# Patient Record
Sex: Female | Born: 1975 | Race: Black or African American | Hispanic: No | Marital: Single | Smoking: Never smoker
Health system: Southern US, Community
[De-identification: ages and names within clinical notes are randomized; demographics above are authoritative.]

## PROBLEM LIST (undated history)

## (undated) DIAGNOSIS — R519 Headache, unspecified: Secondary | ICD-10-CM

## (undated) DIAGNOSIS — E7800 Pure hypercholesterolemia, unspecified: Secondary | ICD-10-CM

## (undated) DIAGNOSIS — K219 Gastro-esophageal reflux disease without esophagitis: Secondary | ICD-10-CM

## (undated) DIAGNOSIS — M549 Dorsalgia, unspecified: Secondary | ICD-10-CM

## (undated) DIAGNOSIS — M7989 Other specified soft tissue disorders: Secondary | ICD-10-CM

## (undated) DIAGNOSIS — G5600 Carpal tunnel syndrome, unspecified upper limb: Secondary | ICD-10-CM

## (undated) DIAGNOSIS — L304 Erythema intertrigo: Secondary | ICD-10-CM

## (undated) DIAGNOSIS — I10 Essential (primary) hypertension: Secondary | ICD-10-CM

## (undated) DIAGNOSIS — R7303 Prediabetes: Secondary | ICD-10-CM

## (undated) DIAGNOSIS — G4730 Sleep apnea, unspecified: Secondary | ICD-10-CM

## (undated) DIAGNOSIS — E559 Vitamin D deficiency, unspecified: Secondary | ICD-10-CM

## (undated) DIAGNOSIS — R4702 Dysphasia: Secondary | ICD-10-CM

## (undated) HISTORY — PX: ECTOPIC PREGNANCY SURGERY: SHX613

## (undated) HISTORY — DX: Erythema intertrigo: L30.4

## (undated) HISTORY — DX: Sleep apnea, unspecified: G47.30

## (undated) HISTORY — DX: Vitamin D deficiency, unspecified: E55.9

## (undated) HISTORY — PX: ABDOMINAL HYSTERECTOMY: SHX81

## (undated) HISTORY — PX: LYMPH NODE BIOPSY: SHX201

## (undated) HISTORY — DX: Gastro-esophageal reflux disease without esophagitis: K21.9

## (undated) HISTORY — DX: Dorsalgia, unspecified: M54.9

## (undated) HISTORY — DX: Carpal tunnel syndrome, unspecified upper limb: G56.00

## (undated) HISTORY — PX: TUBAL LIGATION: SHX77

## (undated) HISTORY — DX: Headache, unspecified: R51.9

## (undated) HISTORY — DX: Prediabetes: R73.03

## (undated) HISTORY — DX: Dysphasia: R47.02

## (undated) HISTORY — DX: Other specified soft tissue disorders: M79.89

## (undated) HISTORY — DX: Pure hypercholesterolemia, unspecified: E78.00

---

## 2008-10-31 DIAGNOSIS — R030 Elevated blood-pressure reading, without diagnosis of hypertension: Secondary | ICD-10-CM

## 2008-10-31 DIAGNOSIS — R42 Dizziness and giddiness: Secondary | ICD-10-CM

## 2008-10-31 DIAGNOSIS — E669 Obesity, unspecified: Secondary | ICD-10-CM

## 2008-10-31 HISTORY — DX: Obesity, unspecified: E66.9

## 2008-10-31 HISTORY — DX: Dizziness and giddiness: R42

## 2008-10-31 HISTORY — DX: Elevated blood-pressure reading, without diagnosis of hypertension: R03.0

## 2009-07-04 DIAGNOSIS — M2550 Pain in unspecified joint: Secondary | ICD-10-CM

## 2009-07-04 HISTORY — DX: Pain in unspecified joint: M25.50

## 2010-04-02 DIAGNOSIS — R059 Cough, unspecified: Secondary | ICD-10-CM

## 2010-04-02 DIAGNOSIS — R1310 Dysphagia, unspecified: Secondary | ICD-10-CM | POA: Insufficient documentation

## 2010-04-02 HISTORY — DX: Dysphagia, unspecified: R13.10

## 2010-04-02 HISTORY — DX: Cough, unspecified: R05.9

## 2010-05-07 DIAGNOSIS — B351 Tinea unguium: Secondary | ICD-10-CM

## 2010-05-07 DIAGNOSIS — R519 Headache, unspecified: Secondary | ICD-10-CM | POA: Insufficient documentation

## 2010-05-07 HISTORY — DX: Tinea unguium: B35.1

## 2010-08-21 DIAGNOSIS — M549 Dorsalgia, unspecified: Secondary | ICD-10-CM

## 2010-08-21 HISTORY — DX: Dorsalgia, unspecified: M54.9

## 2013-01-13 DIAGNOSIS — E88819 Insulin resistance, unspecified: Secondary | ICD-10-CM

## 2013-01-13 HISTORY — DX: Insulin resistance, unspecified: E88.819

## 2016-07-05 DIAGNOSIS — B009 Herpesviral infection, unspecified: Secondary | ICD-10-CM

## 2016-07-05 DIAGNOSIS — Z7689 Persons encountering health services in other specified circumstances: Secondary | ICD-10-CM

## 2016-07-05 HISTORY — DX: Herpesviral infection, unspecified: B00.9

## 2016-07-05 HISTORY — DX: Persons encountering health services in other specified circumstances: Z76.89

## 2017-09-16 ENCOUNTER — Institutional Professional Consult (permissible substitution): Payer: Self-pay | Admitting: Medical

## 2018-04-13 ENCOUNTER — Other Ambulatory Visit: Payer: Self-pay

## 2018-04-13 ENCOUNTER — Encounter (HOSPITAL_COMMUNITY): Payer: Self-pay | Admitting: Obstetrics and Gynecology

## 2018-04-13 ENCOUNTER — Ambulatory Visit (HOSPITAL_BASED_OUTPATIENT_CLINIC_OR_DEPARTMENT_OTHER)
Admission: RE | Admit: 2018-04-13 | Discharge: 2018-04-13 | Disposition: A | Discharge status: Home / Self Care | Payer: BLUE CROSS/BLUE SHIELD | Source: Ambulatory Visit | Attending: Emergency Medicine | Admitting: Emergency Medicine

## 2018-04-13 ENCOUNTER — Emergency Department (HOSPITAL_COMMUNITY)
Admission: EM | Admit: 2018-04-13 | Discharge: 2018-04-13 | Disposition: A | Discharge status: Home / Self Care | Payer: BLUE CROSS/BLUE SHIELD | Attending: Emergency Medicine | Admitting: Emergency Medicine

## 2018-04-13 DIAGNOSIS — R52 Pain, unspecified: Secondary | ICD-10-CM

## 2018-04-13 DIAGNOSIS — M79604 Pain in right leg: Secondary | ICD-10-CM | POA: Insufficient documentation

## 2018-04-13 DIAGNOSIS — Z79899 Other long term (current) drug therapy: Secondary | ICD-10-CM | POA: Insufficient documentation

## 2018-04-13 LAB — COMPREHENSIVE METABOLIC PANEL
ALT: 14 U/L (ref 0–44)
AST: 16 U/L (ref 15–41)
Albumin: 3.6 g/dL (ref 3.5–5.0)
Alkaline Phosphatase: 75 U/L (ref 38–126)
Anion gap: 6 (ref 5–15)
BUN: 11 mg/dL (ref 6–20)
CO2: 26 mmol/L (ref 22–32)
Calcium: 8.4 mg/dL — ABNORMAL LOW (ref 8.9–10.3)
Chloride: 107 mmol/L (ref 98–111)
Creatinine, Ser: 0.77 mg/dL (ref 0.44–1.00)
GFR calc Af Amer: >60 mL/min (ref >60)
GFR calc non Af Amer: >60 mL/min (ref >60)
Glucose, Bld: 95 mg/dL (ref 70–99)
Potassium: 3.9 mmol/L (ref 3.5–5.1)
Sodium: 139 mmol/L (ref 135–145)
Total Bilirubin: 0.4 mg/dL (ref 0.3–1.2)
Total Protein: 6.6 g/dL (ref 6.5–8.1)

## 2018-04-13 LAB — CBC WITH DIFFERENTIAL/PLATELET
ABS IMMATURE GRANULOCYTES: 0.01 10*3/uL (ref 0.00–0.07)
BASOS PCT: 0 %
Basophils Absolute: 0 10*3/uL (ref 0.0–0.1)
EOS ABS: 0.1 10*3/uL (ref 0.0–0.5)
EOS PCT: 3 %
HCT: 35.4 % — ABNORMAL LOW (ref 36.0–46.0)
Hemoglobin: 11.1 g/dL — ABNORMAL LOW (ref 12.0–15.0)
Immature Granulocytes: 0 %
Lymphocytes Relative: 41 %
Lymphs Abs: 1.6 10*3/uL (ref 0.7–4.0)
MCH: 27.9 pg (ref 26.0–34.0)
MCHC: 31.4 g/dL (ref 30.0–36.0)
MCV: 88.9 fL (ref 80.0–100.0)
MONO ABS: 0.4 10*3/uL (ref 0.1–1.0)
MONOS PCT: 9 %
Neutro Abs: 1.8 10*3/uL (ref 1.7–7.7)
Neutrophils Relative %: 47 %
PLATELETS: 310 10*3/uL (ref 150–400)
RBC: 3.98 MIL/uL (ref 3.87–5.11)
RDW: 13.7 % (ref 11.5–15.5)
WBC: 3.9 10*3/uL — ABNORMAL LOW (ref 4.0–10.5)
nRBC: 0 % (ref 0.0–0.2)

## 2018-04-13 MED ORDER — NAPROXEN 500 MG PO TABS: 500.0000 mg | ORAL_TABLET | Freq: Two times a day (BID) | ORAL | 0 refills | Status: DC

## 2018-04-13 MED ORDER — ENOXAPARIN SODIUM 150 MG/ML ~~LOC~~ SOLN
130.0000 mg | SUBCUTANEOUS | Status: AC
  Administered 2018-04-13: 130 mg via SUBCUTANEOUS
  Filled 2018-04-13: qty 0.87

## 2018-04-13 MED ORDER — DEXAMETHASONE 2 MG PO TABS: 10.0000 mg | ORAL_TABLET | Freq: Two times a day (BID) | ORAL | 0 refills | Status: AC

## 2018-04-13 MED ORDER — CYCLOBENZAPRINE HCL 10 MG PO TABS: 10.0000 mg | ORAL_TABLET | Freq: Two times a day (BID) | ORAL | 0 refills | Status: DC | PRN

## 2018-04-13 NOTE — Progress Notes (Signed)
Right lower extremity venous duplex has been completed. Negative for DVT.  04/13/18 9:22 AM Olen Cordial RVT

## 2018-04-13 NOTE — ED Notes (Signed)
Pt verbalized understanding of prescription and when to take medications.

## 2018-04-13 NOTE — ED Provider Notes (Signed)
[facility]-EMERGENCY DEPT Provider Note   CSN: [id number] Arrival date & time: 04/13/18  0033     History   Chief Complaint Chief Complaint  Patient presents with  . Leg Pain    HPI Melinda Ward is a 42 y.o. female.  HPI  2.5wk ago Right leg pain hip down to ankle, burning tingling aching pain Swelling on that leg Pain from ankle to knee feels like a twisting pain, hurts, severe Last week had back pain but thought was from menses Located front of leg and goes straight down, also reports calf pain No injuries  No hx of DVT/PE, not on birth control, no recent surgeries.  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 No pertinent past medical history.  There are no active problems to display for this patient.      OB History    Gravida      Para      Term      Preterm      AB      Living  0     SAB      TAB      Ectopic      Multiple      Live Births               Home Medications    Prior to Admission medications   Medication Sig Start Date End Date Taking? Authorizing Provider  ibuprofen (ADVIL,MOTRIN) 200 MG tablet Take 800 mg by mouth every 6 (six) hours as needed for moderate pain.   Yes [provider]  mirabegron ER (MYRBETRIQ) 25 MG TB24 tablet Take 25 mg by mouth daily.   Yes [provider]  valACYclovir (VALTREX) 500 MG tablet Take 500 mg by mouth daily.   Yes [provider]  cyclobenzaprine (FLEXERIL) 10 MG tablet Take 1 tablet (10 mg total) by mouth 2 (two) times daily as needed for muscle spasms. 04/13/18   Alvira Monday, MD  dexamethasone (DECADRON) 2 MG tablet Take 5 tablets (10 mg total) by mouth 2 (two) times daily with a meal for 1 dose. 04/13/18 04/14/18  Alvira Monday, MD  naproxen (NAPROSYN) 500 MG tablet Take 1 tablet (500 mg total) by mouth 2 (two) times  daily. 04/13/18   Alvira Monday, MD    Family History No family history on file.  Social History Social History   Tobacco Use  . Smoking status: Never Smoker  . Smokeless tobacco: Never Used  Substance Use Topics  . Alcohol use: Yes    Comment: Rarely  . Drug use: Never     Allergies   Patient has no known allergies.   Review of Systems Review of Systems  Constitutional: Negative for fever.  HENT: Negative for sore throat.   Eyes: Negative for visual disturbance.  Respiratory: Negative for cough and shortness of breath.   Cardiovascular: Positive for leg swelling. Negative for chest pain.  Gastrointestinal: Negative for abdominal pain.  Genitourinary: Negative for difficulty urinating.  Musculoskeletal: Positive for arthralgias and myalgias. Negative for back pain and neck pain.  Skin: Negative for rash.  Neurological: Negative for syncope, weakness and headaches.     Physical Exam Updated Vital Signs BP 138/83 (BP Location: Left Arm)   Pulse (!) 53   Temp 98.2 F (36.8 C) (Oral)   Resp 16   Ht 5\' 7"  (1.702 m)  Wt 128.8 kg   LMP 04/04/2018 (Exact Date)   SpO2 100%   BMI 44.48 kg/m   Physical Exam  Constitutional: She is oriented to person, place, and time. She appears well-developed and well-nourished. No distress.  HENT:  Head: Normocephalic and atraumatic.  Eyes: Conjunctivae and EOM are normal.  Neck: Normal range of motion.  Cardiovascular: Normal rate, regular rhythm, normal heart sounds and intact distal pulses. Exam reveals no gallop and no friction rub.  No murmur heard. Pulmonary/Chest: Effort normal and breath sounds normal. No respiratory distress. She has no wheezes. She has no rales.  Abdominal: Soft. She exhibits no distension. There is no tenderness. There is no guarding.  Musculoskeletal: She exhibits no edema or tenderness.  Altered sensation proximal leg anteriorly, lower leg medially Normal strength socklines bilaterally, no  significant edema  Neurological: She is alert and oriented to person, place, and time.  Skin: Skin is warm and dry. No rash noted. She is not diaphoretic. No erythema.  Nursing note and vitals reviewed.    ED Treatments / Results  Labs (all labs ordered are listed, but only abnormal results are displayed) Labs Reviewed  CBC WITH DIFFERENTIAL/PLATELET - Abnormal; Notable for the following components:      Result Value   WBC 3.9 (*)    Hemoglobin 11.1 (*)    HCT 35.4 (*)    All other components within normal limits  COMPREHENSIVE METABOLIC PANEL - Abnormal; Notable for the following components:   Calcium 8.4 (*)    All other components within normal limits    EKG None  Radiology Le Venous  Result Date: 04/13/2018  Lower Venous Study Indications: Pain.  Limitations: Body habitus and poor ultrasound/tissue interface. Performing Technologist: Chanda Busing RVT  Examination Guidelines: A complete evaluation includes B-mode imaging, spectral Doppler, color Doppler, and power Doppler as needed of all accessible portions of each vessel. Bilateral testing is considered an integral part of a complete examination. Limited examinations for reoccurring indications may be performed as noted.  Right Venous Findings: +---------+---------------+---------+-----------+----------+-------+          CompressibilityPhasicitySpontaneityPropertiesSummary +---------+---------------+---------+-----------+----------+-------+ CFV      Full           Yes      Yes                          +---------+---------------+---------+-----------+----------+-------+ SFJ      Full                                                 +---------+---------------+---------+-----------+----------+-------+ FV Prox  Full                                                 +---------+---------------+---------+-----------+----------+-------+ FV Mid                  Yes      Yes                           +---------+---------------+---------+-----------+----------+-------+ FV DistalFull                                                 +---------+---------------+---------+-----------+----------+-------+  PFV      Full                                                 +---------+---------------+---------+-----------+----------+-------+ POP      Full           Yes      Yes                          +---------+---------------+---------+-----------+----------+-------+ PTV      Full                                                 +---------+---------------+---------+-----------+----------+-------+ PERO     Full                                                 +---------+---------------+---------+-----------+----------+-------+  Left Venous Findings: +---+---------------+---------+-----------+----------+-------+    CompressibilityPhasicitySpontaneityPropertiesSummary +---+---------------+---------+-----------+----------+-------+ CFVFull           Yes      Yes                          +---+---------------+---------+-----------+----------+-------+    Summary: Right: There is no evidence of deep vein thrombosis in the lower extremity. However, portions of this examination were limited- see technologist comments above. No cystic structure found in the popliteal fossa. Left: No evidence of common femoral vein obstruction.  *See table(s) above for measurements and observations. Electronically signed by Fabienne Bruns MD on 04/13/2018 at 4:23:25 PM.    Final     Procedures Procedures (including critical care time)  Medications Ordered in ED Medications  enoxaparin (LOVENOX) injection 130 mg (130 mg Subcutaneous Given 04/13/18 0329)     Initial Impression / Assessment and Plan / ED Course  I have reviewed the triage vital signs and the nursing notes.  Pertinent labs & imaging results that were available during my care of the patient were reviewed by me and considered in my  medical decision making (see chart for details).    42 year old female presents with concern for right lower extremity pain, paresthesias and swelling.  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   She has good pulses bilaterally, no sign of arterial occlusion.  No sign of cellulitis or septic arthritis.  No history of trauma, doubt fracture.  If DVT study is negative, suspect most likely etiology of her symptoms is radicular pain coming from the level of L3-L4.  Will give prescription for Decadron, naproxen, Flexeril that patient may initiate in a few days if DVT study is negative. Recommend PCP follow up.  Final Clinical Impressions(s) / ED Diagnoses   Final diagnoses:  Right leg pain    ED Discharge Orders         Ordered    dexamethasone (DECADRON) 2 MG tablet  2 times daily with meals     04/13/18 0429    naproxen (NAPROSYN) 500 MG tablet  2 times daily     04/13/18 0429    cyclobenzaprine (FLEXERIL) 10 MG tablet  2 times daily PRN     04/13/18 0429    LE VENOUS     04/13/18 0417           Alvira Monday, MD 04/13/18 1916

## 2018-04-13 NOTE — Discharge Instructions (Addendum)
I recommend having a DVT study today. If this is positive, you will be continued on blood thinners. If it is negative, I suspect likely radicular etiology of your pain (suspect disc herniation or other at level of L3-L4) and recommend PCP follow up (either way.)  I have provided you with a prescription for a one time dose of steroid, as well as naproxen to take beginning 11/13--ONLY if there are no signs of blood clot. DO NOT TAKE THESE MEDICATIONS UNLESS YOU HAVE NO SIGNS OF BLOOD CLOT, and please do not initiate until 11/13.  If the study does not show blood clots, you may fill and take these medications. You may fill the prescription for the muscle relaxant, flexeril, now.

## 2018-04-13 NOTE — ED Triage Notes (Signed)
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 Pt reports she has felt swelling and heat to the area and the pain is shooting up her thigh and down to her ankle. Pt describes it as feeling like a blowtorch to the calf.  Pt is concerned about a DVT and does not want to go back on the road without having it checked.

## 2018-08-18 ENCOUNTER — Other Ambulatory Visit: Payer: Self-pay

## 2018-08-18 ENCOUNTER — Encounter (HOSPITAL_COMMUNITY): Payer: Self-pay | Admitting: *Deleted

## 2018-08-18 ENCOUNTER — Emergency Department (HOSPITAL_COMMUNITY)
Admission: EM | Admit: 2018-08-18 | Discharge: 2018-08-18 | Disposition: A | Discharge status: Home / Self Care | Payer: BLUE CROSS/BLUE SHIELD | Attending: Emergency Medicine | Admitting: Emergency Medicine

## 2018-08-18 DIAGNOSIS — Z209 Contact with and (suspected) exposure to unspecified communicable disease: Secondary | ICD-10-CM | POA: Insufficient documentation

## 2018-08-18 DIAGNOSIS — Z79899 Other long term (current) drug therapy: Secondary | ICD-10-CM | POA: Insufficient documentation

## 2018-08-18 DIAGNOSIS — B349 Viral infection, unspecified: Secondary | ICD-10-CM | POA: Insufficient documentation

## 2018-08-18 MED ORDER — ONDANSETRON HCL 4 MG PO TABS: 4.0000 mg | ORAL_TABLET | Freq: Four times a day (QID) | ORAL | 0 refills | Status: DC

## 2018-08-18 NOTE — ED Provider Notes (Signed)
[facility] EMERGENCY DEPARTMENT Provider Note   CSN: [id number] Arrival date & time: 08/18/18  1158   History   Chief Complaint Chief Complaint  Patient presents with  . Influenza    HPI Melinda Ward is a 43 y.o. female.     HPI   42 year old female presents today with complaints of respiratory infection.  Patient notes symptoms started yesterday with sore throat, body aches, nausea vomiting and diarrhea.  She notes that her roommate is sick with similar symptoms.  She denies any recent travel or abnormal exposures.  She denies any significant past medical history she is a non-smoker.  No medications prior to arrival today.   History reviewed. No pertinent past medical history.  There are no active problems to display for this patient.   Past Surgical History:  Procedure Laterality Date  . ECTOPIC PREGNANCY SURGERY    . LYMPH NODE BIOPSY    . TUBAL LIGATION       OB History    Gravida      Para      Term      Preterm      AB      Living  0     SAB      TAB      Ectopic      Multiple      Live Births               Home Medications    Prior to Admission medications   Medication Sig Start Date End Date Taking? Authorizing Provider  cyclobenzaprine (FLEXERIL) 10 MG tablet Take 1 tablet (10 mg total) by mouth 2 (two) times daily as needed for muscle spasms. 04/13/18   Alvira Monday, MD  ibuprofen (ADVIL,MOTRIN) 200 MG tablet Take 800 mg by mouth every 6 (six) hours as needed for moderate pain.    [provider]  mirabegron ER (MYRBETRIQ) 25 MG TB24 tablet Take 25 mg by mouth daily.    [provider]  naproxen (NAPROSYN) 500 MG tablet Take 1 tablet (500 mg total) by mouth 2 (two) times daily. 04/13/18   Alvira Monday, MD  ondansetron (ZOFRAN) 4 MG tablet Take 1 tablet (4 mg total) by mouth every 6 (six) hours. 08/18/18   Saron Vanorman, Tinnie Gens, PA-C  valACYclovir (VALTREX) 500 MG tablet Take 500 mg by  mouth daily.    [provider]    Family History History reviewed. No pertinent family history.  Social History Social History   Tobacco Use  . Smoking status: Never Smoker  . Smokeless tobacco: Never Used  Substance Use Topics  . Alcohol use: Yes    Comment: Rarely  . Drug use: Never     Allergies   Patient has no known allergies.   Review of Systems Review of Systems  All other systems reviewed and are negative.    Physical Exam Updated Vital Signs BP 132/90 (BP Location: Right Arm)   Pulse 78   Temp 98.3 F (36.8 C) (Oral)   Resp 16   SpO2 99%   Physical Exam Vitals signs and nursing note reviewed.  Constitutional:      Appearance: She is well-developed.  HENT:     Head: Normocephalic and atraumatic.     Comments: Oropharynx clear no erythema exudate or edema Eyes:     General: No scleral icterus.       Right eye: No discharge.        Left eye: No discharge.  Conjunctiva/sclera: Conjunctivae normal.     Pupils: Pupils are equal, round, and reactive to light.  Neck:     Musculoskeletal: Normal range of motion.     Vascular: No JVD.     Trachea: No tracheal deviation.  Pulmonary:     Effort: Pulmonary effort is normal. No respiratory distress.     Breath sounds: No stridor. No wheezing or rhonchi.  Neurological:     Mental Status: She is alert and oriented to person, place, and time.     Coordination: Coordination normal.  Psychiatric:        Behavior: Behavior normal.        Thought Content: Thought content normal.        Judgment: Judgment normal.     ED Treatments / Results  Labs (all labs ordered are listed, but only abnormal results are displayed) Labs Reviewed - No data to display  EKG None  Radiology No results found.  Procedures Procedures (including critical care time)  Medications Ordered in ED Medications - No data to display   Initial Impression / Assessment and Plan / ED Course  I have reviewed the  triage vital signs and the nursing notes.  Pertinent labs & imaging results that were available during my care of the patient were reviewed by me and considered in my medical decision making (see chart for details).        Labs:   Imaging:  Consults:  Therapeutics:  Discharge Meds: Zofran  Assessment/Plan: 43 year old female presents today with concerns for viral illness.  She is very well-appearing in no acute distress.  She has no signs of acute bacterial etiology.  Low suspicion for influenza, no significant risk factors for coronavirus.  Patient will be discharged with symptomatic care and strict return precautions.  She verbalized understanding and agreement to today's plan had no further questions or concerns at time of discharge.  Final Clinical Impressions(s) / ED Diagnoses   Final diagnoses:  Viral illness    ED Discharge Orders         Ordered    ondansetron (ZOFRAN) 4 MG tablet  Every 6 hours     08/18/18 1237           Eyvonne Mechanic, PA-C 08/18/18 1238    Charlynne Pander, MD 08/18/18 1759

## 2018-08-18 NOTE — ED Notes (Signed)
Patient verbalizes understanding of discharge instructions . Opportunity for questions and answers were provided . Armband removed by staff ,Pt discharged from ED. W/C  offered at D/C  and Declined W/C at D/C and was escorted to lobby by RN.  

## 2018-08-18 NOTE — Discharge Instructions (Addendum)
Please read attached information. If you experience any new or worsening signs or symptoms please return to the emergency room for evaluation. Please follow-up with your primary care provider or specialist as discussed. Please use medication prescribed only as directed and discontinue taking if you have any concerning signs or symptoms.   °

## 2018-08-24 ENCOUNTER — Other Ambulatory Visit: Payer: Self-pay

## 2018-08-24 ENCOUNTER — Encounter (HOSPITAL_COMMUNITY): Payer: Self-pay | Admitting: *Deleted

## 2018-08-24 ENCOUNTER — Emergency Department (HOSPITAL_COMMUNITY)
Admission: EM | Admit: 2018-08-24 | Discharge: 2018-08-24 | Disposition: A | Discharge status: Home / Self Care | Payer: Self-pay | Attending: Emergency Medicine | Admitting: Emergency Medicine

## 2018-08-24 DIAGNOSIS — B9789 Other viral agents as the cause of diseases classified elsewhere: Secondary | ICD-10-CM

## 2018-08-24 DIAGNOSIS — J069 Acute upper respiratory infection, unspecified: Secondary | ICD-10-CM | POA: Insufficient documentation

## 2018-08-24 DIAGNOSIS — Z79899 Other long term (current) drug therapy: Secondary | ICD-10-CM | POA: Insufficient documentation

## 2018-08-24 NOTE — ED Triage Notes (Signed)
PT returns for recheck of cough.

## 2018-08-24 NOTE — Discharge Instructions (Signed)
Please continue over the counter remedies for cough/cold Return if worsening

## 2018-08-24 NOTE — ED Notes (Signed)
Patient verbalizes understanding of discharge instructions . Opportunity for questions and answers were provided . Armband removed by staff ,Pt discharged from ED. W/C  offered at D/C  and Declined W/C at D/C and was escorted to lobby by RN.  

## 2018-08-24 NOTE — ED Provider Notes (Signed)
[facility] EMERGENCY DEPARTMENT Provider Note   CSN: [id number] Arrival date & time: 08/24/18  1348    History   Chief Complaint Chief Complaint  Patient presents with  . Cough    HPI Melinda Ward is a 43 y.o. female who presents with URI symptoms and cough. She states that she has had URI symptoms for about 8 days. She was seen in the ED on 3/17 for the same. She states that she's been out of work and was told not to return until she was feeling better. She developed a fever over the weekend of 101. This lasted until Sunday and has gotten better. She reports some lingering ear fullness on the L side, some throat swelling, and a mild cough. She has been taking OTC meds with relief. She denies any travel or sick contacts. No headache, chest pain, SOB. She wants to go back to work.     HPI  History reviewed. No pertinent past medical history.  There are no active problems to display for this patient.   Past Surgical History:  Procedure Laterality Date  . ECTOPIC PREGNANCY SURGERY    . LYMPH NODE BIOPSY    . TUBAL LIGATION       OB History    Gravida      Para      Term      Preterm      AB      Living  0     SAB      TAB      Ectopic      Multiple      Live Births               Home Medications    Prior to Admission medications   Medication Sig Start Date End Date Taking? Authorizing Provider  cyclobenzaprine (FLEXERIL) 10 MG tablet Take 1 tablet (10 mg total) by mouth 2 (two) times daily as needed for muscle spasms. 04/13/18   Alvira Monday, MD  ibuprofen (ADVIL,MOTRIN) 200 MG tablet Take 800 mg by mouth every 6 (six) hours as needed for moderate pain.    [provider]  mirabegron ER (MYRBETRIQ) 25 MG TB24 tablet Take 25 mg by mouth daily.    [provider]  naproxen (NAPROSYN) 500 MG tablet Take 1 tablet (500 mg total) by mouth 2 (two) times daily. 04/13/18   Alvira Monday, MD  ondansetron  (ZOFRAN) 4 MG tablet Take 1 tablet (4 mg total) by mouth every 6 (six) hours. 08/18/18   Hedges, Tinnie Gens, PA-C  valACYclovir (VALTREX) 500 MG tablet Take 500 mg by mouth daily.    [provider]    Family History History reviewed. No pertinent family history.  Social History Social History   Tobacco Use  . Smoking status: Never Smoker  . Smokeless tobacco: Never Used  Substance Use Topics  . Alcohol use: Yes    Comment: Rarely  . Drug use: Never     Allergies   Patient has no known allergies.   Review of Systems Review of Systems  Constitutional: Positive for fever (resolved).  HENT: Positive for congestion.   Respiratory: Positive for cough. Negative for shortness of breath.   Cardiovascular: Negative for chest pain.  All other systems reviewed and are negative.    Physical Exam Updated Vital Signs BP 135/86 (BP Location: Right Arm)   Pulse 85   Temp 98.6 F (37 C) (Oral)   Resp 20   SpO2 100%  Physical Exam Vitals signs and nursing note reviewed.  Constitutional:      General: She is not in acute distress.    Appearance: Normal appearance. She is well-developed. She is not ill-appearing.     Comments: Calm and cooperative. Pleasant. Well appearing  HENT:     Head: Normocephalic and atraumatic.     Right Ear: Tympanic membrane normal.     Left Ear: Tympanic membrane normal.     Nose: Nose normal.     Mouth/Throat:     Mouth: Mucous membranes are moist.  Eyes:     General: No scleral icterus.       Right eye: No discharge.        Left eye: No discharge.     Conjunctiva/sclera: Conjunctivae normal.     Pupils: Pupils are equal, round, and reactive to light.  Neck:     Musculoskeletal: Normal range of motion.  Cardiovascular:     Rate and Rhythm: Normal rate and regular rhythm.  Pulmonary:     Effort: Pulmonary effort is normal. No respiratory distress.     Breath sounds: Normal breath sounds.  Abdominal:     General: There is no  distension.  Skin:    General: Skin is warm and dry.  Neurological:     Mental Status: She is alert and oriented to person, place, and time.  Psychiatric:        Behavior: Behavior normal.      ED Treatments / Results  Labs (all labs ordered are listed, but only abnormal results are displayed) Labs Reviewed - No data to display  EKG None  Radiology No results found.  Procedures Procedures (including critical care time)  Medications Ordered in ED Medications - No data to display   Initial Impression / Assessment and Plan / ED Course  I have reviewed the triage vital signs and the nursing notes.  Pertinent labs & imaging results that were available during my care of the patient were reviewed by me and considered in my medical decision making (see chart for details).  43 year old female presents with resolving URI symptoms and cough.  She was seen last week for the same.  She states that overall she is been improving and is primarily here for a work note.  Her vital signs are normal.  Exam is overall unremarkable.  She states she last had a fever on Saturday.  She was given a work note to go back on Wednesday.  Final Clinical Impressions(s) / ED Diagnoses   Final diagnoses:  Viral URI with cough    ED Discharge Orders    None       Bethel Born, PA-C 08/24/18 1531    Blane Ohara, MD 08/24/18 1911

## 2018-09-09 ENCOUNTER — Other Ambulatory Visit: Payer: Self-pay | Admitting: Obstetrics and Gynecology

## 2018-09-09 DIAGNOSIS — N6310 Unspecified lump in the right breast, unspecified quadrant: Secondary | ICD-10-CM

## 2018-10-05 ENCOUNTER — Ambulatory Visit
Admission: RE | Admit: 2018-10-05 | Discharge: 2018-10-05 | Disposition: A | Discharge status: Home / Self Care | Payer: Managed Care, Other (non HMO) | Source: Ambulatory Visit | Attending: Obstetrics and Gynecology | Admitting: Obstetrics and Gynecology

## 2018-10-05 ENCOUNTER — Other Ambulatory Visit: Payer: Self-pay

## 2018-10-05 DIAGNOSIS — N6310 Unspecified lump in the right breast, unspecified quadrant: Secondary | ICD-10-CM

## 2019-02-19 DIAGNOSIS — K219 Gastro-esophageal reflux disease without esophagitis: Secondary | ICD-10-CM | POA: Insufficient documentation

## 2019-02-19 DIAGNOSIS — H6993 Unspecified Eustachian tube disorder, bilateral: Secondary | ICD-10-CM

## 2019-02-19 DIAGNOSIS — J310 Chronic rhinitis: Secondary | ICD-10-CM

## 2019-02-19 DIAGNOSIS — H6983 Other specified disorders of Eustachian tube, bilateral: Secondary | ICD-10-CM | POA: Insufficient documentation

## 2019-02-19 DIAGNOSIS — R0989 Other specified symptoms and signs involving the circulatory and respiratory systems: Secondary | ICD-10-CM | POA: Insufficient documentation

## 2019-02-19 DIAGNOSIS — R09A2 Foreign body sensation, throat: Secondary | ICD-10-CM

## 2019-02-19 HISTORY — DX: Chronic rhinitis: J31.0

## 2019-02-19 HISTORY — DX: Foreign body sensation, throat: R09.A2

## 2019-02-19 HISTORY — DX: Gastro-esophageal reflux disease without esophagitis: K21.9

## 2019-02-19 HISTORY — DX: Unspecified eustachian tube disorder, bilateral: H69.93

## 2019-05-05 DIAGNOSIS — G5711 Meralgia paresthetica, right lower limb: Secondary | ICD-10-CM

## 2019-05-05 HISTORY — DX: Meralgia paresthetica, right lower limb: G57.11

## 2019-05-14 ENCOUNTER — Other Ambulatory Visit: Payer: Self-pay

## 2019-05-14 ENCOUNTER — Other Ambulatory Visit (HOSPITAL_COMMUNITY): Payer: Self-pay | Admitting: Obstetrics and Gynecology

## 2019-05-14 ENCOUNTER — Ambulatory Visit (HOSPITAL_COMMUNITY)
Admission: RE | Admit: 2019-05-14 | Discharge: 2019-05-14 | Disposition: A | Discharge status: Home / Self Care | Payer: Managed Care, Other (non HMO) | Source: Ambulatory Visit | Attending: Obstetrics and Gynecology | Admitting: Obstetrics and Gynecology

## 2019-05-14 DIAGNOSIS — M79605 Pain in left leg: Secondary | ICD-10-CM

## 2019-05-14 DIAGNOSIS — M7989 Other specified soft tissue disorders: Secondary | ICD-10-CM | POA: Insufficient documentation

## 2019-05-14 NOTE — Progress Notes (Addendum)
LLE venous duplex       has been completed. Preliminary results can be found under CV proc through chart review. June Leap, BS, RDMS, RVT  Attempted call report with no answer. Office is closed.

## 2019-07-13 ENCOUNTER — Institutional Professional Consult (permissible substitution): Payer: Managed Care, Other (non HMO) | Admitting: Pulmonary Disease

## 2020-04-14 ENCOUNTER — Other Ambulatory Visit: Payer: Self-pay

## 2020-04-14 ENCOUNTER — Encounter: Payer: Self-pay | Admitting: Podiatry

## 2020-04-14 ENCOUNTER — Ambulatory Visit (INDEPENDENT_AMBULATORY_CARE_PROVIDER_SITE_OTHER): Payer: 59

## 2020-04-14 ENCOUNTER — Ambulatory Visit (INDEPENDENT_AMBULATORY_CARE_PROVIDER_SITE_OTHER): Payer: 59 | Admitting: Podiatry

## 2020-04-14 DIAGNOSIS — M2011 Hallux valgus (acquired), right foot: Secondary | ICD-10-CM

## 2020-04-14 DIAGNOSIS — M79671 Pain in right foot: Secondary | ICD-10-CM | POA: Diagnosis not present

## 2020-04-14 DIAGNOSIS — Z01818 Encounter for other preprocedural examination: Secondary | ICD-10-CM

## 2020-04-14 NOTE — Progress Notes (Signed)
Subjective:  Patient ID: Melinda Ward, female    DOB: 1975/06/12,  MRN: [id number]  Chief Complaint  Patient presents with  . Toe Pain    Right toe going under 2nd toe. Painful to wear shoes     44 y.o. female presents with the above complaint.  Patient presents with complaint of painful bunion deformity to the right toe.  Patient states that it started to get progressively worse.  She states that this has been going on for quite some time and over time it the big toe most on the right under the second toe.  She states she cannot wear normal shoes because it causes pain.  She would like to discuss treatment options for this.  She denies any other acute complaints.  She has tried all conservative treatment options including padding protecting spacers but none of which has helped.  She at this time would like to discuss surgical options for this.  She is not a diabetic.  Review of Systems: Negative except as noted in the HPI. Denies N/V/F/Ch.  History reviewed. No pertinent past medical history.  Current Outpatient Medications:  .  cyclobenzaprine (FLEXERIL) 10 MG tablet, Take 1 tablet (10 mg total) by mouth 2 (two) times daily as needed for muscle spasms., Disp: 20 tablet, Rfl: 0 .  fluticasone (FLOVENT DISKUS) 50 MCG/BLIST diskus inhaler, Inhale into the lungs., Disp: , Rfl:  .  ibuprofen (ADVIL,MOTRIN) 200 MG tablet, Take 800 mg by mouth every 6 (six) hours as needed for moderate pain., Disp: , Rfl:  .  mirabegron ER (MYRBETRIQ) 25 MG TB24 tablet, Take 25 mg by mouth daily., Disp: , Rfl:  .  naproxen (NAPROSYN) 500 MG tablet, Take 1 tablet (500 mg total) by mouth 2 (two) times daily., Disp: 30 tablet, Rfl: 0 .  ondansetron (ZOFRAN) 4 MG tablet, Take 1 tablet (4 mg total) by mouth every 6 (six) hours., Disp: 12 tablet, Rfl: 0 .  valACYclovir (VALTREX) 500 MG tablet, Take 500 mg by mouth daily., Disp: , Rfl:   Social History   Tobacco Use  Smoking Status Never Smoker  Smokeless  Tobacco Never Used    Allergies  Allergen Reactions  . Latex Rash   Objective:  There were no vitals filed for this visit. There is no height or weight on file to calculate BMI. Constitutional Well developed. Well nourished.  Vascular Dorsalis pedis pulses palpable bilaterally. Posterior tibial pulses palpable bilaterally. Capillary refill normal to all digits.  No cyanosis or clubbing noted. Pedal hair growth normal.  Neurologic Normal speech. Oriented to person, place, and time. Epicritic sensation to light touch grossly present bilaterally.  Dermatologic Nails well groomed and normal in appearance. No open wounds. No skin lesions.  Orthopedic: Normal joint ROM without pain or crepitus bilaterally. Hallux abductovalgus deformity present right.  This is a reducible deformity.  No intra-articular first metatarsophalangeal joint pain. left 1st MPJ full range of motion. Left 1st TMT without gross hypermobility. Right 1st MPJ diminished range of motion  Right 1st TMT without gross hypermobility. Lesser digital contractures absent bilaterally.   Radiographs: Taken and reviewed. Hallux abductovalgus deformity present. Metatarsal parabola normal. 1st/2nd IMA: Moderate; TSP: 4 out of 7.  Mild to moderate bunion deformity noted.  There is an increasing amount of phalangeal's angle.  There is increasing hallux abductus angle.  Assessment:   1. Foot pain, right   2. Hallux abducto valgus, right   3. Preoperative examination    Plan:  Patient was evaluated and  treated and all questions answered.  Hallux abductovalgus deformity, right -XR as above. -Patient has failed all conservative therapy and wishes to proceed with surgical intervention. All risks, benefits, and alternatives discussed with patient. No guarantees given. Consent reviewed and signed by patient. Post-op course explained at length. -Planned procedures: Chevron osteotomy with screw fixation with possible phalangeal  osteotomy. -Risk factors: None -I explained patient the etiology of hallux bunion deformity and various treatment options were discussed.  I believe patient will benefit of surgical intervention with correction of the bunion.  I discussed with the patient extensive to my surgical plan as well as postop protocol which includes weightbearing as tolerated in surgical shoe.  Patient states understanding like to proceed with the surgery. -Informed surgical risk consent was reviewed and read aloud to the patient.  I reviewed the films.  I have discussed my findings with the patient in great detail.  I have discussed all risks including but not limited to infection, stiffness, scarring, limp, disability, deformity, damage to blood vessels and nerves, numbness, poor healing, need for braces, arthritis, chronic pain, amputation, death.  All benefits and realistic expectations discussed in great detail.  I have made no promises as to the outcome.  I have provided realistic expectations.  I have offered the patient a 2nd opinion, which they have declined and assured me they preferred to proceed despite the risks -A total of 45 minutes was spent in direct patient care as well as pre and post patient encounter activities.  This includes documentation as well as reviewing patient chart for labs, imaging, past medical, surgical, social, and family history as documented in the EMR.  I have reviewed medication allergies as documented in EMR.  I discussed the etiology of condition and treatment options from conservative to surgical care.  All risks and benefit of the treatment course was discussed in detail.  All questions were answered and return appointment was discussed.  Since the visit completed in an ambulatory/outpatient setting, the patient and/or parent/guardian has been advised to contact the providers office for worsening condition and seek medical treatment and/or call 911 if the patient deems either is  necessary.   No follow-ups on file.

## 2020-04-18 ENCOUNTER — Other Ambulatory Visit: Payer: Self-pay | Admitting: Podiatry

## 2020-04-18 DIAGNOSIS — M2011 Hallux valgus (acquired), right foot: Secondary | ICD-10-CM

## 2020-04-25 ENCOUNTER — Ambulatory Visit (INDEPENDENT_AMBULATORY_CARE_PROVIDER_SITE_OTHER): Payer: 59 | Admitting: Orthotics

## 2020-04-25 ENCOUNTER — Other Ambulatory Visit: Payer: Self-pay

## 2020-04-25 DIAGNOSIS — M2011 Hallux valgus (acquired), right foot: Secondary | ICD-10-CM

## 2020-04-25 DIAGNOSIS — M2012 Hallux valgus (acquired), left foot: Secondary | ICD-10-CM

## 2020-04-25 DIAGNOSIS — M79671 Pain in right foot: Secondary | ICD-10-CM

## 2020-04-26 ENCOUNTER — Other Ambulatory Visit: Payer: 59 | Admitting: Orthotics

## 2020-04-26 NOTE — Progress Notes (Signed)
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 Patient is active youth and demonstrates over pronation in gait, prominent medially shifted talus, and collapse of medial column.  Goals are RF stability, longitudinal arch support, decrease in pronation, and ease of discomfort in mobility related activities.   

## 2020-05-03 HISTORY — PX: FOOT SURGERY: SHX648

## 2020-05-04 ENCOUNTER — Telehealth: Payer: Self-pay | Admitting: Podiatry

## 2020-05-04 NOTE — Telephone Encounter (Signed)
Pt also stated they said something about them being medically necessary.

## 2020-05-04 NOTE — Telephone Encounter (Signed)
Pt called and stated she got information from insurance company that they are denying the orthotic claim but they would cover if we got a pre authorization for it.   I told pt I would call and get the prior auth started.

## 2020-05-04 NOTE — Telephone Encounter (Signed)
DOS: 05-15-2020  Procedures: [place] [phone]) and Double Osteotomy Rt (74099)  [employer] Effective From 06/04/2019 - 06/02/2020  Deductible: $1,800 with $ 2,780.04 met and $ 35.80 remains. Out of Pocket: $3,600 with $1,764.20 met and $1,835.80 remains. CoInsurance: 20% Copay: $0  No referrals are required.

## 2020-05-05 ENCOUNTER — Telehealth: Payer: Self-pay

## 2020-05-05 NOTE — Telephone Encounter (Signed)
Do you have prefilled template or something I could sign

## 2020-05-05 NOTE — Telephone Encounter (Signed)
DOS 05/15/2020  [place] BUNIONECTOMY RT - 28296 DOUBLE OSTEOTOMY RT - 28299  [facility] EFFECTIVE DATE - 06/04/2019  PLAN DEDUCTIBLE - $1800.00 W/ $35.80 REMAINING OUT OF POCKET - $3600.00 W/ $1835.80 REMAINING COPAY $0.00 COINSURANCE - 20%   NOTIFICATION/PRIOR AUTHORIZATION NUMBER CASE STATUS CASE STATUS REASON PRIMARY CARE PHYSICIAN [id number] Closed Case Was Managed And Is Now Complete - ADVANCE NOTIFY DATE/TIME ADMISSION NOTIFY DATE/TIME 05/04/2020 09:51 AM CST - COVERAGE STATUS OVERALL COVERAGE STATUS Covered/Approved 1-2 CODE DESCRIPTION COVERAGE STATUS DECISION DATE [facility] Coverage determination is reflected for the facility admission and is not a guarantee of payment for ongoing services. Covered/Approved 05/04/2020 1 28296 Correction, hallux valgus (bunionectomy) more Covered/Approved 05/04/2020 2 28299 Correction, hallux valgus (bunionectomy) more Covered/Approved 05/04/2020

## 2020-05-05 NOTE — Telephone Encounter (Signed)
Okay let me know if there is anything that I can do.

## 2020-05-15 ENCOUNTER — Telehealth: Payer: Self-pay | Admitting: Podiatry

## 2020-05-15 ENCOUNTER — Telehealth: Payer: Self-pay

## 2020-05-15 DIAGNOSIS — M2011 Hallux valgus (acquired), right foot: Secondary | ICD-10-CM

## 2020-05-15 MED ORDER — IBUPROFEN 800 MG PO TABS: 800.0000 mg | ORAL_TABLET | Freq: Four times a day (QID) | ORAL | 1 refills | Status: DC | PRN

## 2020-05-15 MED ORDER — OXYCODONE-ACETAMINOPHEN 10-325 MG PO TABS: 1.0000 | ORAL_TABLET | ORAL | 0 refills | Status: DC | PRN

## 2020-05-15 MED ORDER — ONDANSETRON HCL 4 MG PO TABS: 4.0000 mg | ORAL_TABLET | Freq: Three times a day (TID) | ORAL | 0 refills | Status: DC | PRN

## 2020-05-15 NOTE — Telephone Encounter (Signed)
Pt had surgery today and would like to know if she could get an order for a knee scooter please advise.

## 2020-05-15 NOTE — Addendum Note (Signed)
Addended by: Nicholes Rough on: 05/15/2020 08:29 AM   Modules accepted: Orders

## 2020-05-15 NOTE — Telephone Encounter (Signed)
Pt is needing a prior authorization for her pain meds. Please call pharmacy

## 2020-05-15 NOTE — Addendum Note (Signed)
Addended by: Nicholes Rough on: 05/15/2020 07:22 AM   Modules accepted: Orders

## 2020-05-16 ENCOUNTER — Telehealth: Payer: Self-pay | Admitting: *Deleted

## 2020-05-16 ENCOUNTER — Telehealth: Payer: Self-pay | Admitting: Podiatry

## 2020-05-16 ENCOUNTER — Other Ambulatory Visit: Payer: Self-pay | Admitting: Podiatry

## 2020-05-16 MED ORDER — TRAMADOL HCL 50 MG PO TABS: 50.0000 mg | ORAL_TABLET | Freq: Three times a day (TID) | ORAL | 0 refills | Status: DC | PRN

## 2020-05-16 MED ORDER — TRAMADOL HCL 50 MG PO TABS: 50.0000 mg | ORAL_TABLET | Freq: Four times a day (QID) | ORAL | 0 refills | Status: AC | PRN

## 2020-05-16 NOTE — Telephone Encounter (Signed)
Received a call back from Amel at [employer] and he said the authorization was denied.They were to be faxing the denial with all information on it.

## 2020-05-16 NOTE — Telephone Encounter (Signed)
Received a voicemail from [employer] [facility] [employer] stating he needed a fax of pts clinicals and please include billing codes, dx codes and cost and fax to [phone]..ref # [id number]...  Faxed clinical and other information he requested.

## 2020-05-16 NOTE — Telephone Encounter (Signed)
Called and informed patient per Dr Eliane Decree note and to call back if problem persist, verbalized understanding.

## 2020-05-16 NOTE — Telephone Encounter (Signed)
Placed order with [facility] for Knee scooter.

## 2020-05-16 NOTE — Telephone Encounter (Signed)
Thank you :)

## 2020-05-16 NOTE — Telephone Encounter (Signed)
Patient is having extreme sickness from taking the Oxycodone 10-325mg  medication. She has tried w/ nausea medication, does not help if taking before, after or w/o nausea medication.please advise.

## 2020-05-16 NOTE — Telephone Encounter (Signed)
Yes she may have a knee scooter I placed the order and I am not sure what to do after that.

## 2020-05-16 NOTE — Progress Notes (Signed)
Patient called, had issues with where her pain medication was sent. New Rx was sent to [facility] at [facility] [address] and [address]. Tramadol q6 #20.

## 2020-05-16 NOTE — Telephone Encounter (Signed)
I sent tramadol to the patient as well as see if she can tolerate that better.  She can also break the Percocet in half and take it off if she is able to tolerate that better

## 2020-05-23 ENCOUNTER — Encounter: Payer: 59 | Admitting: Orthotics

## 2020-05-24 ENCOUNTER — Ambulatory Visit (INDEPENDENT_AMBULATORY_CARE_PROVIDER_SITE_OTHER): Payer: 59

## 2020-05-24 ENCOUNTER — Other Ambulatory Visit: Payer: Self-pay

## 2020-05-24 ENCOUNTER — Ambulatory Visit (INDEPENDENT_AMBULATORY_CARE_PROVIDER_SITE_OTHER): Payer: 59 | Admitting: Podiatry

## 2020-05-24 DIAGNOSIS — M2011 Hallux valgus (acquired), right foot: Secondary | ICD-10-CM | POA: Diagnosis not present

## 2020-05-24 DIAGNOSIS — Z9889 Other specified postprocedural states: Secondary | ICD-10-CM

## 2020-05-24 MED ORDER — HYDROCODONE-ACETAMINOPHEN 5-325 MG PO TABS: 1.0000 | ORAL_TABLET | Freq: Four times a day (QID) | ORAL | 0 refills | Status: DC | PRN

## 2020-05-29 ENCOUNTER — Encounter: Payer: Self-pay | Admitting: Podiatry

## 2020-05-29 NOTE — Progress Notes (Signed)
  Subjective:  Patient ID: Melinda Ward, female    DOB: 1975/06/04,  MRN: [id number]  Chief Complaint  Patient presents with  . Routine Post Op    Pt stated that she is in a lot of pain and has a lot of burning and tingling sensations.    DOS: 05/15/2020 Procedure: Right foot bunion deformity   44 y.o. female returns for post-op check. Patient is doing well. She is in a lot of pain. She has been taking her pain medication. She is also here to pick up her orthotics. She denies any other acute complaints.  Review of Systems: Negative except as noted in the HPI. Denies N/V/F/Ch.  No past medical history on file.  Current Outpatient Medications:  .  fluticasone (FLOVENT DISKUS) 50 MCG/BLIST diskus inhaler, Inhale into the lungs., Disp: , Rfl:  .  HYDROcodone-acetaminophen (NORCO) 5-325 MG tablet, Take 1 tablet by mouth every 6 (six) hours as needed for moderate pain., Disp: 30 tablet, Rfl: 0 .  ibuprofen (ADVIL) 800 MG tablet, Take 1 tablet (800 mg total) by mouth every 6 (six) hours as needed., Disp: 60 tablet, Rfl: 1 .  omeprazole (PRILOSEC) 40 MG capsule, Take 40 mg by mouth daily. prn, Disp: , Rfl:  .  ondansetron (ZOFRAN) 4 MG tablet, Take 1 tablet (4 mg total) by mouth every 8 (eight) hours as needed for nausea or vomiting., Disp: 20 tablet, Rfl: 0 .  oxyCODONE-acetaminophen (PERCOCET) 10-325 MG tablet, Take 1 tablet by mouth every 4 (four) hours as needed for pain., Disp: 30 tablet, Rfl: 0 .  valACYclovir (VALTREX) 1000 MG tablet, SMARTSIG:1 Tablet(s) By Mouth Every 12 Hours PRN, Disp: , Rfl:   Social History   Tobacco Use  Smoking Status Never Smoker  Smokeless Tobacco Never Used    Allergies  Allergen Reactions  . Latex Rash   Objective:  There were no vitals filed for this visit. There is no height or weight on file to calculate BMI. Constitutional Well developed. Well nourished.  Vascular Foot warm and well perfused. Capillary refill normal to all digits.    Neurologic Normal speech. Oriented to person, place, and time. Epicritic sensation to light touch grossly present bilaterally.  Dermatologic Skin healing well without signs of infection. Skin edges well coapted without signs of infection.  Orthopedic: Tenderness to palpation noted about the surgical site.   Radiographs: 3 views of skeletally mature adult right foot: Good correction alignment noted hardware is intact. No signs of loosening or backing out noted. Sesamoid position has returned to normal Assessment:   1. Hallux abducto valgus, right   2. Status post foot surgery    Plan:  Patient was evaluated and treated and all questions answered.  S/p foot surgery right -Progressing as expected post-operatively. -XR: See above -WB Status: Weightbearing as tolerated in cam boot -Sutures: Intact. No signs of dehiscence noted. No complication noted. -Medications: None -Foot redressed.  No follow-ups on file.

## 2020-06-07 ENCOUNTER — Ambulatory Visit (INDEPENDENT_AMBULATORY_CARE_PROVIDER_SITE_OTHER): Payer: 59 | Admitting: Podiatry

## 2020-06-07 ENCOUNTER — Other Ambulatory Visit: Payer: Self-pay

## 2020-06-07 DIAGNOSIS — Z9889 Other specified postprocedural states: Secondary | ICD-10-CM

## 2020-06-07 DIAGNOSIS — M2011 Hallux valgus (acquired), right foot: Secondary | ICD-10-CM

## 2020-06-08 ENCOUNTER — Encounter: Payer: Self-pay | Admitting: Podiatry

## 2020-06-08 NOTE — Progress Notes (Signed)
  Subjective:  Patient ID: Melinda Ward, female    DOB: January 28, 1976,  MRN: [id number]  Chief Complaint  Patient presents with  . Routine Post Op    Pt stated that she is doing okay she does have some pain on the lateral side of her foot along with the site she had surgery on.    DOS: 05/15/2020 Procedure: Right foot bunion deformity   45 y.o. female returns for post-op check. Patient is doing well. She is in a lot of pain.  She has not had to take any medication.  She also has orthotics with her.  She denies any other acute complaints  Review of Systems: Negative except as noted in the HPI. Denies N/V/F/Ch.  No past medical history on file.  Current Outpatient Medications:  .  fluticasone (FLOVENT DISKUS) 50 MCG/BLIST diskus inhaler, Inhale into the lungs., Disp: , Rfl:  .  HYDROcodone-acetaminophen (NORCO) 5-325 MG tablet, Take 1 tablet by mouth every 6 (six) hours as needed for moderate pain., Disp: 30 tablet, Rfl: 0 .  ibuprofen (ADVIL) 800 MG tablet, Take 1 tablet (800 mg total) by mouth every 6 (six) hours as needed., Disp: 60 tablet, Rfl: 1 .  omeprazole (PRILOSEC) 40 MG capsule, Take 40 mg by mouth daily. prn, Disp: , Rfl:  .  ondansetron (ZOFRAN) 4 MG tablet, Take 1 tablet (4 mg total) by mouth every 8 (eight) hours as needed for nausea or vomiting., Disp: 20 tablet, Rfl: 0 .  oxyCODONE-acetaminophen (PERCOCET) 10-325 MG tablet, Take 1 tablet by mouth every 4 (four) hours as needed for pain., Disp: 30 tablet, Rfl: 0 .  valACYclovir (VALTREX) 1000 MG tablet, SMARTSIG:1 Tablet(s) By Mouth Every 12 Hours PRN, Disp: , Rfl:   Social History   Tobacco Use  Smoking Status Never Smoker  Smokeless Tobacco Never Used    Allergies  Allergen Reactions  . Latex Rash   Objective:  There were no vitals filed for this visit. There is no height or weight on file to calculate BMI. Constitutional Well developed. Well nourished.  Vascular Foot warm and well perfused. Capillary  refill normal to all digits.   Neurologic Normal speech. Oriented to person, place, and time. Epicritic sensation to light touch grossly present bilaterally.  Dermatologic Skin healing well without signs of infection. Skin edges well coapted without signs of infection.  Orthopedic:  Mild tenderness to palpation noted about the surgical site.   Radiographs: 3 views of skeletally mature adult right foot: Good correction alignment noted hardware is intact. No signs of loosening or backing out noted. Sesamoid position has returned to normal Assessment:   1. Hallux abducto valgus, right   2. Status post foot surgery    Plan:  Patient was evaluated and treated and all questions answered.  S/p foot surgery right -Progressing as expected post-operatively. -XR: See above -WB Status: Weightbearing as tolerated in cam boot -Sutures: Stitches are removed.  No signs of dehiscence noted. No complication noted. -Medications: None -I instructed her to actively and passively improve the range of motion of the first metatarsophalangeal joint.  Patient states understanding.  She can start transition to regular shoes whenever she feels comfortable.  No follow-ups on file.

## 2020-06-19 ENCOUNTER — Other Ambulatory Visit: Payer: Self-pay | Admitting: Podiatry

## 2020-06-19 MED ORDER — ONDANSETRON HCL 4 MG PO TABS: 4.0000 mg | ORAL_TABLET | Freq: Three times a day (TID) | ORAL | 0 refills | Status: DC | PRN

## 2020-06-19 MED ORDER — CEPHALEXIN 500 MG PO CAPS: 500.0000 mg | ORAL_CAPSULE | Freq: Three times a day (TID) | ORAL | 0 refills | Status: DC

## 2020-06-19 MED ORDER — OXYCODONE-ACETAMINOPHEN 10-325 MG PO TABS: 1.0000 | ORAL_TABLET | Freq: Four times a day (QID) | ORAL | 0 refills | Status: DC | PRN

## 2020-06-19 NOTE — Progress Notes (Signed)
Patient called stating she was traveling and left her pain medication in [place] as well as the nausea medication. She states she has still been experiencing a lot of pain and burning. She states the area has been warm and somewhat red since the sutures were removed. She is not on antibiotics. Denies any fevers, chills. I have sent Percocet, Zofran and Keflex to the pharmacy. Watch for any worsening signs/symtpms of infection and go to the ER if worsening. Recommend follow up with Dr. Allena Katz this week.   Vivi Barrack

## 2020-06-28 ENCOUNTER — Other Ambulatory Visit: Payer: Self-pay

## 2020-06-28 ENCOUNTER — Ambulatory Visit (INDEPENDENT_AMBULATORY_CARE_PROVIDER_SITE_OTHER): Payer: 59

## 2020-06-28 ENCOUNTER — Ambulatory Visit (INDEPENDENT_AMBULATORY_CARE_PROVIDER_SITE_OTHER): Payer: 59 | Admitting: Podiatry

## 2020-06-28 DIAGNOSIS — M2011 Hallux valgus (acquired), right foot: Secondary | ICD-10-CM | POA: Diagnosis not present

## 2020-06-28 DIAGNOSIS — Z9889 Other specified postprocedural states: Secondary | ICD-10-CM | POA: Diagnosis not present

## 2020-06-28 MED ORDER — METHYLPREDNISOLONE 4 MG PO TBPK: ORAL_TABLET | Freq: Four times a day (QID) | ORAL | 0 refills | Status: DC

## 2020-06-28 NOTE — Progress Notes (Signed)
  Subjective:  Patient ID: Melinda Ward, female    DOB: Aug 21, 1975,  MRN: [id number]  Chief Complaint  Patient presents with  . Routine Post Op    POST OP DOS 12.13.21     DOS: 05/15/2020 Procedure: Right foot bunion deformity   45 y.o. female returns for post-op check. Patient is doing well. She is in a lot of pain.  She has not had to take any medication.  She also has orthotics with her.  She denies any other acute complaints  Review of Systems: Negative except as noted in the HPI. Denies N/V/F/Ch.  No past medical history on file.  Current Outpatient Medications:  .  methylPREDNISolone (MEDROL DOSEPAK) 4 MG TBPK tablet, Take by mouth taper from 4 doses each day to 1 dose and stop., Disp: 20 each, Rfl: 0 .  cephALEXin (KEFLEX) 500 MG capsule, Take 1 capsule (500 mg total) by mouth 3 (three) times daily., Disp: 30 capsule, Rfl: 0 .  fluticasone (FLOVENT DISKUS) 50 MCG/BLIST diskus inhaler, Inhale into the lungs., Disp: , Rfl:  .  HYDROcodone-acetaminophen (NORCO) 5-325 MG tablet, Take 1 tablet by mouth every 6 (six) hours as needed for moderate pain., Disp: 30 tablet, Rfl: 0 .  ibuprofen (ADVIL) 800 MG tablet, Take 1 tablet (800 mg total) by mouth every 6 (six) hours as needed., Disp: 60 tablet, Rfl: 1 .  omeprazole (PRILOSEC) 40 MG capsule, Take 40 mg by mouth daily. prn, Disp: , Rfl:  .  ondansetron (ZOFRAN) 4 MG tablet, Take 1 tablet (4 mg total) by mouth every 8 (eight) hours as needed for nausea or vomiting., Disp: 20 tablet, Rfl: 0 .  oxyCODONE-acetaminophen (PERCOCET) 10-325 MG tablet, Take 1 tablet by mouth every 6 (six) hours as needed for pain., Disp: 15 tablet, Rfl: 0 .  valACYclovir (VALTREX) 1000 MG tablet, SMARTSIG:1 Tablet(s) By Mouth Every 12 Hours PRN, Disp: , Rfl:   Social History   Tobacco Use  Smoking Status Never Smoker  Smokeless Tobacco Never Used    Allergies  Allergen Reactions  . Latex Rash   Objective:  There were no vitals filed for this  visit. There is no height or weight on file to calculate BMI. Constitutional Well developed. Well nourished.  Vascular Foot warm and well perfused. Capillary refill normal to all digits.   Neurologic Normal speech. Oriented to person, place, and time. Epicritic sensation to light touch grossly present bilaterally.  Dermatologic  skin completely epithelialized.  No clinical signs of infection.  Orthopedic:   tenderness to palpation noted about the surgical site.  Swelling present as well.   Radiographs: 3 views of skeletally mature adult right foot: Good correction alignment noted hardware is intact. No signs of loosening or backing out noted. Sesamoid position has returned to normal Assessment:   1. Hallux abducto valgus, right    Plan:  Patient was evaluated and treated and all questions answered.  S/p foot surgery right -Progressing as expected post-operatively. -XR: See above -WB Status: Weightbearing as tolerated in cam boot -Sutures: None.  No dehiscence noted no complication noted. -Medications: None -Surgical shoe was dispensed to help with the transition to regular shoes.  Continue finishing her antibiotics for mild redness. -Take Medrol Dosepak for skin and soft tissue prophylaxis with inflammation  No follow-ups on file.

## 2020-08-02 ENCOUNTER — Ambulatory Visit (INDEPENDENT_AMBULATORY_CARE_PROVIDER_SITE_OTHER): Payer: 59 | Admitting: Podiatry

## 2020-08-02 ENCOUNTER — Encounter: Payer: Self-pay | Admitting: Podiatry

## 2020-08-02 ENCOUNTER — Other Ambulatory Visit: Payer: Self-pay

## 2020-08-02 DIAGNOSIS — M2011 Hallux valgus (acquired), right foot: Secondary | ICD-10-CM

## 2020-08-02 DIAGNOSIS — Z9889 Other specified postprocedural states: Secondary | ICD-10-CM

## 2020-08-04 ENCOUNTER — Encounter: Payer: Self-pay | Admitting: Podiatry

## 2020-08-04 NOTE — Progress Notes (Signed)
Subjective:  Patient ID: Melinda Ward, female    DOB: 06-01-1976,  MRN: [id number]  Chief Complaint  Patient presents with  . Routine Post Op    Post op dos 12.13.21    DOS: 05/15/2020 Procedure: Right foot bunion deformity   45 y.o. female returns for post-op check.  She is doing well.  She does not have any pain.  She has been able to ambulate in regular sneakers without any acute complaints.  She has some occasional numbness tingling but overall no acute problems.  She denies any other issues.  Review of Systems: Negative except as noted in the HPI. Denies N/V/F/Ch.  No past medical history on file.  Current Outpatient Medications:  .  cephALEXin (KEFLEX) 500 MG capsule, Take 1 capsule (500 mg total) by mouth 3 (three) times daily., Disp: 30 capsule, Rfl: 0 .  fluticasone (FLOVENT DISKUS) 50 MCG/BLIST diskus inhaler, Inhale into the lungs., Disp: , Rfl:  .  HYDROcodone-acetaminophen (NORCO) 5-325 MG tablet, Take 1 tablet by mouth every 6 (six) hours as needed for moderate pain., Disp: 30 tablet, Rfl: 0 .  ibuprofen (ADVIL) 800 MG tablet, Take 1 tablet (800 mg total) by mouth every 6 (six) hours as needed., Disp: 60 tablet, Rfl: 1 .  methylPREDNISolone (MEDROL DOSEPAK) 4 MG TBPK tablet, Take by mouth taper from 4 doses each day to 1 dose and stop., Disp: 20 each, Rfl: 0 .  omeprazole (PRILOSEC) 40 MG capsule, Take 40 mg by mouth daily. prn, Disp: , Rfl:  .  ondansetron (ZOFRAN) 4 MG tablet, Take 1 tablet (4 mg total) by mouth every 8 (eight) hours as needed for nausea or vomiting., Disp: 20 tablet, Rfl: 0 .  oxyCODONE-acetaminophen (PERCOCET) 10-325 MG tablet, Take 1 tablet by mouth every 6 (six) hours as needed for pain., Disp: 15 tablet, Rfl: 0 .  valACYclovir (VALTREX) 1000 MG tablet, SMARTSIG:1 Tablet(s) By Mouth Every 12 Hours PRN, Disp: , Rfl:   Social History   Tobacco Use  Smoking Status Never Smoker  Smokeless Tobacco Never Used    Allergies  Allergen  Reactions  . Latex Rash   Objective:  There were no vitals filed for this visit. There is no height or weight on file to calculate BMI. Constitutional Well developed. Well nourished.  Vascular Foot warm and well perfused. Capillary refill normal to all digits.   Neurologic Normal speech. Oriented to person, place, and time. Epicritic sensation to light touch grossly present bilaterally.  Dermatologic  skin completely epithelialized.  No clinical signs of infection.  Orthopedic:  No tenderness to palpation noted to the surgical site.  Mild swelling present.  Good correction alignment noted with good range of motion of the first metatarsophalangeal joint noted.  No intra-articular pain to the first MPJ noted.   Radiographs: 3 views of skeletally mature adult right foot: Good correction alignment noted hardware is intact. No signs of loosening or backing out noted. Sesamoid position has returned to normal Assessment:   1. Hallux abducto valgus, right   2. Status post foot surgery    Plan:  Patient was evaluated and treated and all questions answered.  S/p foot surgery right -Progressing as expected post-operatively. -XR: See above -WB Status: Weight-bear as tolerated in regular shoes -Sutures: None.  No dehiscence noted no complication noted. -Medications: None -Clinically patient's pain has improved considerably.  She has improved range of motion.  I have asked her to transition into regular shoes.  I will see her back in 8  weeks for final postop visit.  No follow-ups on file.

## 2020-10-04 ENCOUNTER — Encounter: Payer: 59 | Admitting: Podiatry

## 2020-10-13 ENCOUNTER — Encounter: Payer: Self-pay | Admitting: Podiatry

## 2020-10-13 ENCOUNTER — Other Ambulatory Visit: Payer: Self-pay

## 2020-10-13 ENCOUNTER — Ambulatory Visit (INDEPENDENT_AMBULATORY_CARE_PROVIDER_SITE_OTHER): Payer: 59

## 2020-10-13 ENCOUNTER — Ambulatory Visit (INDEPENDENT_AMBULATORY_CARE_PROVIDER_SITE_OTHER): Payer: 59 | Admitting: Podiatry

## 2020-10-13 DIAGNOSIS — M2011 Hallux valgus (acquired), right foot: Secondary | ICD-10-CM

## 2020-10-13 DIAGNOSIS — Z9889 Other specified postprocedural states: Secondary | ICD-10-CM

## 2020-10-17 ENCOUNTER — Encounter: Payer: Self-pay | Admitting: Podiatry

## 2020-10-17 NOTE — Progress Notes (Signed)
Subjective:  Patient ID: Melinda Ward, female    DOB: [birth year],  MRN: [id number]  Chief Complaint  Patient presents with  . Routine Post Op    POV #5 DOS 05/15/2020 RT CORRECTION OF HALLUX VALGUS W/POSSIBLE PHALANGEAL OSTEOTOMY Only concerns today w/ bending and soreness.     DOS: 05/15/2020 Procedure: Right foot bunion deformity   45 y.o. female returns for post-op check.  She is doing well.  She does not have any pain.  She has been able to ambulate in regular sneakers without any acute complaints.  She has some occasional numbness tingling but overall no acute problems.  She denies any other issues.  Review of Systems: Negative except as noted in the HPI. Denies N/V/F/Ch.  No past medical history on file.  Current Outpatient Medications:  .  azithromycin (ZITHROMAX) 250 MG tablet, Take by mouth., Disp: , Rfl:  .  cephALEXin (KEFLEX) 500 MG capsule, Take 1 capsule (500 mg total) by mouth 3 (three) times daily., Disp: 30 capsule, Rfl: 0 .  fluticasone (FLOVENT DISKUS) 50 MCG/BLIST diskus inhaler, Inhale into the lungs., Disp: , Rfl:  .  HYDROcodone-acetaminophen (NORCO) 5-325 MG tablet, Take 1 tablet by mouth every 6 (six) hours as needed for moderate pain., Disp: 30 tablet, Rfl: 0 .  hydrOXYzine (ATARAX/VISTARIL) 25 MG tablet, Take 25 mg by mouth daily., Disp: , Rfl:  .  ibuprofen (ADVIL) 800 MG tablet, Take 1 tablet (800 mg total) by mouth every 6 (six) hours as needed., Disp: 60 tablet, Rfl: 1 .  methylPREDNISolone (MEDROL DOSEPAK) 4 MG TBPK tablet, Take by mouth taper from 4 doses each day to 1 dose and stop., Disp: 20 each, Rfl: 0 .  nystatin-triamcinolone (MYCOLOG II) cream, Apply topically 2 (two) times daily., Disp: , Rfl:  .  omeprazole (PRILOSEC) 40 MG capsule, Take 40 mg by mouth daily. prn, Disp: , Rfl:  .  ondansetron (ZOFRAN) 4 MG tablet, Take 1 tablet (4 mg total) by mouth every 8 (eight) hours as needed for nausea or vomiting., Disp: 20 tablet, Rfl: 0 .   oxyCODONE-acetaminophen (PERCOCET) 10-325 MG tablet, Take 1 tablet by mouth every 6 (six) hours as needed for pain., Disp: 15 tablet, Rfl: 0 .  phentermine (ADIPEX-P) 37.5 MG tablet, Take 37.5 mg by mouth every morning., Disp: , Rfl:  .  valACYclovir (VALTREX) 1000 MG tablet, SMARTSIG:1 Tablet(s) By Mouth Every 12 Hours PRN, Disp: , Rfl:  .  valACYclovir (VALTREX) 500 MG tablet, Take 500 mg by mouth daily., Disp: , Rfl:  .  Vitamin D, Ergocalciferol, (DRISDOL) 1.25 MG (50000 UNIT) CAPS capsule, Take 1 capsule by mouth once a week., Disp: , Rfl:   Social History   Tobacco Use  Smoking Status Never Smoker  Smokeless Tobacco Never Used    Allergies  Allergen Reactions  . Latex Rash   Objective:  There were no vitals filed for this visit. There is no height or weight on file to calculate BMI. Constitutional Well developed. Well nourished.  Vascular Foot warm and well perfused. Capillary refill normal to all digits.   Neurologic Normal speech. Oriented to person, place, and time. Epicritic sensation to light touch grossly present bilaterally.  Dermatologic  skin completely epithelialized.  No clinical signs of infection.  Orthopedic:  No tenderness to palpation noted to the surgical site.  Mild swelling present.  Good correction alignment noted with good range of motion of the first metatarsophalangeal joint noted.  No intra-articular pain to the first MPJ noted.  Radiographs: 3 views of skeletally mature adult right foot: Good correction alignment noted hardware is intact. No signs of loosening or backing out noted. Sesamoid position has returned to normal Assessment:   1. Hallux abducto valgus, right   2. Status post foot surgery    Plan:  Patient was evaluated and treated and all questions answered.  S/p foot surgery right -Progressing as expected post-operatively. -XR: See above -WB Status: Weight-bear as tolerated in regular shoes -Sutures: None.  No dehiscence noted no  complication noted. -Medications: None -Clinically patient's pain has improved considerably.  Patient is officially discharged from my care.  At this time if any foot and ankle issues arise he will come back and see me.  No follow-ups on file.

## 2021-08-03 ENCOUNTER — Emergency Department (HOSPITAL_BASED_OUTPATIENT_CLINIC_OR_DEPARTMENT_OTHER)
Admission: EM | Admit: 2021-08-03 | Discharge: 2021-08-03 | Disposition: A | Discharge status: Home / Self Care | Payer: Managed Care, Other (non HMO) | Attending: Emergency Medicine | Admitting: Emergency Medicine

## 2021-08-03 ENCOUNTER — Encounter (HOSPITAL_BASED_OUTPATIENT_CLINIC_OR_DEPARTMENT_OTHER): Payer: Self-pay

## 2021-08-03 ENCOUNTER — Other Ambulatory Visit: Payer: Self-pay

## 2021-08-03 ENCOUNTER — Emergency Department (HOSPITAL_BASED_OUTPATIENT_CLINIC_OR_DEPARTMENT_OTHER): Payer: Managed Care, Other (non HMO)

## 2021-08-03 DIAGNOSIS — R202 Paresthesia of skin: Secondary | ICD-10-CM | POA: Insufficient documentation

## 2021-08-03 DIAGNOSIS — Z91040 Latex allergy status: Secondary | ICD-10-CM | POA: Diagnosis not present

## 2021-08-03 DIAGNOSIS — R519 Headache, unspecified: Secondary | ICD-10-CM | POA: Insufficient documentation

## 2021-08-03 HISTORY — DX: Essential (primary) hypertension: I10

## 2021-08-03 LAB — COMPREHENSIVE METABOLIC PANEL
ALT: 11 U/L (ref 0–44)
AST: 14 U/L — ABNORMAL LOW (ref 15–41)
Albumin: 4.1 g/dL (ref 3.5–5.0)
Alkaline Phosphatase: 78 U/L (ref 38–126)
Anion gap: 9 (ref 5–15)
BUN: 11 mg/dL (ref 6–20)
CO2: 25 mmol/L (ref 22–32)
Calcium: 9.2 mg/dL (ref 8.9–10.3)
Chloride: 105 mmol/L (ref 98–111)
Creatinine, Ser: 0.73 mg/dL (ref 0.44–1.00)
GFR, Estimated: >60 mL/min (ref >60)
Glucose, Bld: 77 mg/dL (ref 70–99)
Potassium: 3.4 mmol/L — ABNORMAL LOW (ref 3.5–5.1)
Sodium: 139 mmol/L (ref 135–145)
Total Bilirubin: 0.4 mg/dL (ref 0.3–1.2)
Total Protein: 7.3 g/dL (ref 6.5–8.1)

## 2021-08-03 LAB — HCG, QUANTITATIVE, PREGNANCY: hCG, Beta Chain, Quant, S: <1 m[IU]/mL (ref <5)

## 2021-08-03 LAB — CBC WITH DIFFERENTIAL/PLATELET
Abs Immature Granulocytes: 0.02 10*3/uL (ref 0.00–0.07)
Basophils Absolute: 0 10*3/uL (ref 0.0–0.1)
Basophils Relative: 0 %
Eosinophils Absolute: 0.1 10*3/uL (ref 0.0–0.5)
Eosinophils Relative: 3 %
HCT: 37.9 % (ref 36.0–46.0)
Hemoglobin: 12.2 g/dL (ref 12.0–15.0)
Immature Granulocytes: 1 %
Lymphocytes Relative: 35 %
Lymphs Abs: 1.5 10*3/uL (ref 0.7–4.0)
MCH: 28 pg (ref 26.0–34.0)
MCHC: 32.2 g/dL (ref 30.0–36.0)
MCV: 87.1 fL (ref 80.0–100.0)
Monocytes Absolute: 0.4 10*3/uL (ref 0.1–1.0)
Monocytes Relative: 10 %
Neutro Abs: 2.3 10*3/uL (ref 1.7–7.7)
Neutrophils Relative %: 51 %
Platelets: 313 10*3/uL (ref 150–400)
RBC: 4.35 MIL/uL (ref 3.87–5.11)
RDW: 13.3 % (ref 11.5–15.5)
WBC: 4.4 10*3/uL (ref 4.0–10.5)
nRBC: 0 % (ref 0.0–0.2)

## 2021-08-03 LAB — URINALYSIS, ROUTINE W REFLEX MICROSCOPIC
Bilirubin Urine: NEGATIVE
Glucose, UA: NEGATIVE mg/dL
Hgb urine dipstick: NEGATIVE
Ketones, ur: NEGATIVE mg/dL
Leukocytes,Ua: NEGATIVE
Nitrite: NEGATIVE
Specific Gravity, Urine: 1.025 (ref 1.005–1.030)
pH: 5.5 (ref 5.0–8.0)

## 2021-08-03 MED ORDER — IOHEXOL 350 MG/ML SOLN
100.0000 mL | Freq: Once | INTRAVENOUS | Status: AC | PRN
  Administered 2021-08-03: 100 mL via INTRAVENOUS

## 2021-08-03 NOTE — ED Notes (Signed)
Patient verbalizes understanding of discharge instructions. Opportunity for questioning and answers were provided. Patient discharged from ED.  °

## 2021-08-03 NOTE — Discharge Instructions (Addendum)
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  You will need to follow up with your doctor regarding these findings. ? ?Call your primary care doctor or specialist as discussed in the next 2-3 days.   ?Return immediately back to the ER if: ? ?Your symptoms worsen within the next 12-24 hours. ?You develop new symptoms such as new fevers, persistent vomiting, new pain, shortness of breath, or new weakness or numbness, or if you have any other concerns. ? ?

## 2021-08-03 NOTE — ED Triage Notes (Addendum)
Headache with left side body pain, numbness and tingling Symptoms started 2 months ago. Was seen by PMD and was started antihypertensive meds. ?

## 2021-08-03 NOTE — ED Notes (Signed)
Patient transported to CT 

## 2021-08-03 NOTE — ED Provider Notes (Signed)
?[place] [facility] DEPT ?Provider Note ? ? ?CSN: [id number] ?Arrival date & time: 08/03/21  [id number] ? ?  ? ?History ? ?Chief Complaint  ?Patient presents with  ? Headache  ? ? ?Melinda Ward is a 46 y.o. female. ? ?46 year old female without significant past medical history who presents to the emergency department today secondary to progressively worsening headache with paresthesias.  Patient states that for the last few months she had intermittent headaches but specifically last 2 months she has had a persistent constant headache that has progressively worsened.  It is associated with left arm paresthesias in that shoulder and lower arm mostly.  Sometimes her hand feels cold as well.  No weakness there.  She states that no trauma or no other injuries prior to this.  She is lost 20 pounds recently by watching her calories and this does not seem to have helped if anything is gotten worse.  She also states that over the last week or so she started having similar symptoms in her left leg.  No history of multiple sclerosis or other neurologic disease for her nothing that she knows about her family.  She states that she saw her primary doctor about this and they thought that her blood pressure was causing it started on some blood pressure medication which she does not take and still is not hypertensive.  She has not noticed any swelling in her face or abnormal color of her face or neck. ? ? ?Headache ? ?  ? ?Home Medications ?Prior to Admission medications   ?Medication Sig Start Date End Date Taking? Authorizing Provider  ?azithromycin (ZITHROMAX) 250 MG tablet Take by mouth. 08/21/20   [provider]  ?cephALEXin (KEFLEX) 500 MG capsule Take 1 capsule (500 mg total) by mouth 3 (three) times daily. 06/19/20   Trula Slade, DPM  ?fluticasone (FLOVENT DISKUS) 50 MCG/BLIST diskus inhaler Inhale into the lungs.    [provider]  ?HYDROcodone-acetaminophen (NORCO) 5-325 MG tablet  Take 1 tablet by mouth every 6 (six) hours as needed for moderate pain. 05/24/20   Felipa Furnace, DPM  ?hydrOXYzine (ATARAX/VISTARIL) 25 MG tablet Take 25 mg by mouth daily. 06/13/20   [provider]  ?ibuprofen (ADVIL) 800 MG tablet Take 1 tablet (800 mg total) by mouth every 6 (six) hours as needed. 05/15/20   Felipa Furnace, DPM  ?methylPREDNISolone (MEDROL DOSEPAK) 4 MG TBPK tablet Take by mouth taper from 4 doses each day to 1 dose and stop. 06/28/20   Felipa Furnace, DPM  ?nystatin-triamcinolone (MYCOLOG II) cream Apply topically 2 (two) times daily. 06/12/20   [provider]  ?omeprazole (PRILOSEC) 40 MG capsule Take 40 mg by mouth daily. prn    [provider]  ?ondansetron (ZOFRAN) 4 MG tablet Take 1 tablet (4 mg total) by mouth every 8 (eight) hours as needed for nausea or vomiting. 06/19/20   Trula Slade, DPM  ?oxyCODONE-acetaminophen (PERCOCET) 10-325 MG tablet Take 1 tablet by mouth every 6 (six) hours as needed for pain. 06/19/20   Trula Slade, DPM  ?phentermine (ADIPEX-P) 37.5 MG tablet Take 37.5 mg by mouth every morning. 10/08/20   [provider]  ?valACYclovir (VALTREX) 1000 MG tablet SMARTSIG:1 Tablet(s) By Mouth Every 12 Hours PRN 05/11/20   [provider]  ?valACYclovir (VALTREX) 500 MG tablet Take 500 mg by mouth daily. 10/08/20   [provider]  ?Vitamin D, Ergocalciferol, (DRISDOL) 1.25 MG (50000 UNIT) CAPS capsule Take 1 capsule by  mouth once a week. 10/08/20   [provider]  ?   ? ?Allergies    ?Latex   ? ?Review of Systems   ?Review of Systems  ?Neurological:  Positive for headaches.  ? ?Physical Exam ?Updated Vital Signs ?BP 112/67   Pulse 71   Temp 97.9 ?F (36.6 ?C) (Oral)   Resp 16   Ht 5\' 7"  (1.702 m)   Wt 126.6 kg   LMP 04/04/2018 (Exact Date)   SpO2 100%   BMI 43.70 kg/m?  ?Physical Exam ?Vitals and nursing note reviewed.  ?Constitutional:   ?   Appearance: She is well-developed.  ?HENT:  ?   Head:  Normocephalic and atraumatic.  ?Eyes:  ?   General: No visual field deficit. ?Cardiovascular:  ?   Rate and Rhythm: Normal rate and regular rhythm.  ?Pulmonary:  ?   Effort: No respiratory distress.  ?   Breath sounds: No stridor.  ?Abdominal:  ?   General: There is no distension.  ?Musculoskeletal:     ?   General: No swelling or tenderness. Normal range of motion.  ?   Cervical back: Normal range of motion.  ?Skin: ?   General: Skin is warm and dry.  ?Neurological:  ?   Mental Status: She is alert and oriented to person, place, and time.  ?   GCS: GCS eye subscore is 4. GCS verbal subscore is 5. GCS motor subscore is 6.  ?   Cranial Nerves: No cranial nerve deficit or facial asymmetry.  ?   Sensory: No sensory deficit.  ?   Motor: No weakness.  ?Psychiatric:     ?   Mood and Affect: Mood normal.  ? ? ?ED Results / Procedures / Treatments   ?Labs ?(all labs ordered are listed, but only abnormal results are displayed) ?Labs Reviewed  ?URINALYSIS, ROUTINE W REFLEX MICROSCOPIC - Abnormal; Notable for the following components:  ?    Result Value  ? APPearance HAZY (*)   ? Protein, ur TRACE (*)   ? Bacteria, UA FEW (*)   ? All other components within normal limits  ?CBC WITH DIFFERENTIAL/PLATELET  ?COMPREHENSIVE METABOLIC PANEL  ?HCG, QUANTITATIVE, PREGNANCY  ? ? ?EKG ?None ? ?Radiology ?No results found. ? ?Procedures ?Procedures  ? ? ?Medications Ordered in ED ?Medications - No data to display ? ?ED Course/ Medical Decision Making/ A&P ?  ?                        ?Medical Decision Making ?Amount and/or Complexity of Data Reviewed ?Labs: ordered. ?Radiology: ordered. ? ? ?Differential diagnosis for symptoms are quite large.  Could be electrolyte abnormality, cervical spine abnormality, vascular abnormality, demyelinating disease, peripheral neuropathy.  This been going on for couple months we will get a CT angio of the head and neck to make sure there is no vascular or obvious masses that could be causing this.  If  these are negative would consider starting her on Lyrica and having her follow-up with neurology for further management.  Is able to ambulate without difficulty and use her arms without difficulty so I do not see any reason why she would need admission to the hospital or a more emergent work-up ? ? ?  ?Final Clinical Impression(s) / ED Diagnoses ?Final diagnoses:  ?None  ? ? ?Rx / DC Orders ?ED Discharge Orders   ? ? None  ? ?  ? ? ?  ?Azarian Starace, Corene Cornea, MD ?08/03/21  0714 ? ?

## 2021-08-03 NOTE — ED Provider Notes (Signed)
Patient signed out to me is pending CT imaging.  CT imaging shows no acute findings.  No aneurysm no stenosis no blockage noted. ? ?Symptoms have been going on for longer than 2 months.  No acute etiology noted on imaging.  However there was incidental finding of fibromuscular dysplasia reported as mild per radiologist.  Patient educated regarding these findings.  Advised outpatient follow-up with her doctor this week to continue additional diagnostic testing as needed. ? ?Advising return for worsening symptoms fevers cough or any additional concerns. ?  ?Cheryll Cockayne, MD ?08/03/21 [phone] ? ?

## 2021-08-21 ENCOUNTER — Encounter: Payer: Self-pay | Admitting: Diagnostic Neuroimaging

## 2021-08-21 ENCOUNTER — Ambulatory Visit (INDEPENDENT_AMBULATORY_CARE_PROVIDER_SITE_OTHER): Payer: Managed Care, Other (non HMO) | Admitting: Diagnostic Neuroimaging

## 2021-08-21 VITALS — BP 127/81 | HR 68 | Ht 67.0 in | Wt 286.0 lb

## 2021-08-21 DIAGNOSIS — G4719 Other hypersomnia: Secondary | ICD-10-CM

## 2021-08-21 DIAGNOSIS — R0683 Snoring: Secondary | ICD-10-CM

## 2021-08-21 DIAGNOSIS — R200 Anesthesia of skin: Secondary | ICD-10-CM

## 2021-08-21 NOTE — Patient Instructions (Signed)
?  NUMBNESS, HEADACHES, VERTIGO ?- check MRI brain, cervical spine, labs ? ?SNORING, EARLY MORNING HEADACHES, WEIGHT GAIN ?- refer to sleep study ?

## 2021-08-21 NOTE — Progress Notes (Signed)
? ?[facility] ? ?PATIENT: Melinda Ward ?DOB: 05/06/1976 ? ?REFERRING CLINICIAN: Salli RealSun, Yun, MD ?HISTORY FROM: patient  ?REASON FOR VISIT: new consult  ? ? ?HISTORICAL ? ?CHIEF COMPLAINT:  ?Chief Complaint  ?Patient presents with  ? Paresthesia  ?  Rm 7 New Pt "numbness, tingling, burning in left hand from elbow down and in my left foot, occas in right hand, some joints in fingers are stiff and swollen on right hand"   ? ? ?HISTORY OF PRESENT ILLNESS:  ? ?46 year old female here for evaluation of numbness headaches and vertigo.  2022 patient had onset of intermittent up-and-down falling sensation like she was on a roller coaster.  Her past 4 months she has had some numbness in her left hand.  08/03/2021 patient went to the hospital because of severe heart racing sensation, headaches, numbness and concern.  Patient had CTA of head and neck which was unremarkable.  Patient referred here for further follow-up. ? ?Now having some numbness in left hand and left foot.  Having some mild headaches. ? ? ?REVIEW OF SYSTEMS: Full 14 system review of systems performed and negative with exception of: as per HPI. ? ?ALLERGIES: ?Allergies  ?Allergen Reactions  ? Latex Rash  ? ? ?HOME MEDICATIONS: ?Outpatient Medications Prior to Visit  ?Medication Sig Dispense Refill  ? amLODipine (NORVASC) 5 MG tablet Take 5 mg by mouth daily.    ? ergocalciferol (VITAMIN D2) 1.25 MG (50000 UT) capsule Take 50,000 Units by mouth once a week.    ? hydrochlorothiazide (MICROZIDE) 12.5 MG capsule Take 12.5 mg by mouth daily.    ? ibuprofen (ADVIL) 800 MG tablet Take 1 tablet (800 mg total) by mouth every 6 (six) hours as needed. 60 tablet 1  ? omeprazole (PRILOSEC) 40 MG capsule Take 40 mg by mouth daily. prn    ? valACYclovir (VALTREX) 1000 MG tablet SMARTSIG:1 Tablet(s) By Mouth Every 12 Hours PRN    ? OVER THE COUNTER MEDICATION Acid reflux    ? phentermine (ADIPEX-P) 37.5 MG tablet Take 37.5 mg by mouth every morning.  (Patient not taking: Reported on 08/21/2021)    ? azithromycin (ZITHROMAX) 250 MG tablet Take by mouth.    ? cephALEXin (KEFLEX) 500 MG capsule Take 1 capsule (500 mg total) by mouth 3 (three) times daily. 30 capsule 0  ? fluticasone (FLOVENT DISKUS) 50 MCG/BLIST diskus inhaler Inhale into the lungs.    ? HYDROcodone-acetaminophen (NORCO) 5-325 MG tablet Take 1 tablet by mouth every 6 (six) hours as needed for moderate pain. 30 tablet 0  ? hydrOXYzine (ATARAX/VISTARIL) 25 MG tablet Take 25 mg by mouth daily.    ? methylPREDNISolone (MEDROL DOSEPAK) 4 MG TBPK tablet Take by mouth taper from 4 doses each day to 1 dose and stop. 20 each 0  ? nystatin-triamcinolone (MYCOLOG II) cream Apply topically 2 (two) times daily.    ? ondansetron (ZOFRAN) 4 MG tablet Take 1 tablet (4 mg total) by mouth every 8 (eight) hours as needed for nausea or vomiting. 20 tablet 0  ? oxyCODONE-acetaminophen (PERCOCET) 10-325 MG tablet Take 1 tablet by mouth every 6 (six) hours as needed for pain. 15 tablet 0  ? valACYclovir (VALTREX) 500 MG tablet Take 500 mg by mouth daily.    ? Vitamin D, Ergocalciferol, (DRISDOL) 1.25 MG (50000 UNIT) CAPS capsule Take 1 capsule by mouth once a week.    ? ?No facility-administered medications prior to visit.  ? ? ?PAST MEDICAL HISTORY: ?Past Medical History:  ?Diagnosis  Date  ? Headache   ? Hypertension   ? ? ?PAST SURGICAL HISTORY: ?Past Surgical History:  ?Procedure Laterality Date  ? ABDOMINAL HYSTERECTOMY    ? ECTOPIC PREGNANCY SURGERY    ? FOOT SURGERY Right 05/2020  ? LYMPH NODE BIOPSY    ? TUBAL LIGATION    ? ? ?FAMILY HISTORY: ?Family History  ?Problem Relation Age of Onset  ? Cancer Mother   ?     cervical  ? ? ?SOCIAL HISTORY: ?Social History  ? ?Socioeconomic History  ? Marital status: Single  ?  Spouse name: Not on file  ? Number of children: 0  ? Years of education: Not on file  ? Highest education level: Associate degree: academic program  ?Occupational History  ?  Comment: [employer]  ?Tobacco  Use  ? Smoking status: Never  ? Smokeless tobacco: Never  ?Vaping Use  ? Vaping Use: Never used  ?Substance and Sexual Activity  ? Alcohol use: Yes  ?  Comment: once a week  ? Drug use: Never  ? Sexual activity: Yes  ?Other Topics Concern  ? Not on file  ?Social History Narrative  ? Caffeine- sodas 2-3 a day, tea  ? ?Social Determinants of Health  ? ?Financial Resource Strain: Not on file  ?Food Insecurity: Not on file  ?Transportation Needs: Not on file  ?Physical Activity: Not on file  ?Stress: Not on file  ?Social Connections: Not on file  ?Intimate Partner Violence: Not on file  ? ? ? ?PHYSICAL EXAM ? ?GENERAL EXAM/CONSTITUTIONAL: ?Vitals:  ?Vitals:  ? 08/21/21 1049  ?BP: 127/81  ?Pulse: 68  ?Weight: 286 lb (129.7 kg)  ?Height: 5\' 7"  (1.702 m)  ? ?Body mass index is 44.79 kg/m?. ?Wt Readings from Last 3 Encounters:  ?08/21/21 286 lb (129.7 kg)  ?08/03/21 279 lb (126.6 kg)  ?04/13/18 284 lb (128.8 kg)  ? ?Patient is in no distress; well developed, nourished and groomed; neck is supple ? ?CARDIOVASCULAR: ?Examination of carotid arteries is normal; no carotid bruits ?Regular rate and rhythm, no murmurs ?Examination of peripheral vascular system by observation and palpation is normal ? ?EYES: ?Ophthalmoscopic exam of optic discs and posterior segments is normal; no papilledema or hemorrhages ?No results found. ? ?MUSCULOSKELETAL: ?Gait, strength, tone, movements noted in Neurologic exam below ? ?NEUROLOGIC: ?MENTAL STATUS:  ?No flowsheet data found. ?awake, alert, oriented to person, place and time ?recent and remote memory intact ?normal attention and concentration ?language fluent, comprehension intact, naming intact ?fund of knowledge appropriate ? ?CRANIAL NERVE:  ?2nd - no papilledema on fundoscopic exam ?2nd, 3rd, 4th, 6th - pupils equal and reactive to light, visual fields full to confrontation, extraocular muscles intact, no nystagmus ?5th - facial sensation symmetric ?7th - facial strength symmetric ?8th -  hearing intact ?9th - palate elevates symmetrically, uvula midline ?11th - shoulder shrug symmetric ?12th - tongue protrusion midline ? ?MOTOR:  ?normal bulk and tone, full strength in the BUE, BLE ? ?SENSORY:  ?normal and symmetric to light touch, temperature, vibration ? ?COORDINATION:  ?finger-nose-finger, fine finger movements normal ? ?REFLEXES:  ?deep tendon reflexes present and symmetric ? ?GAIT/STATION:  ?narrow based gait ? ? ? ? ?DIAGNOSTIC DATA (LABS, IMAGING, TESTING) ?- I reviewed patient records, labs, notes, testing and imaging myself where available. ? ?Lab Results  ?Component Value Date  ? WBC 4.4 08/03/2021  ? HGB 12.2 08/03/2021  ? HCT 37.9 08/03/2021  ? MCV 87.1 08/03/2021  ? PLT 313 08/03/2021  ? ?   ?  Component Value Date/Time  ? NA 139 08/03/2021 0624  ? K 3.4 (L) 08/03/2021 9211  ? CL 105 08/03/2021 0624  ? CO2 25 08/03/2021 0624  ? GLUCOSE 77 08/03/2021 0624  ? BUN 11 08/03/2021 0624  ? CREATININE 0.73 08/03/2021 0624  ? CALCIUM 9.2 08/03/2021 0624  ? PROT 7.3 08/03/2021 0624  ? ALBUMIN 4.1 08/03/2021 0624  ? AST 14 (L) 08/03/2021 9417  ? ALT 11 08/03/2021 0624  ? ALKPHOS 78 08/03/2021 0624  ? BILITOT 0.4 08/03/2021 0624  ? GFRNONAA >60 08/03/2021 0624  ? GFRAA >60 04/13/2018 0341  ? ?No results found for: CHOL, HDL, LDLCALC, LDLDIRECT, TRIG, CHOLHDL ?No results found for: HGBA1C ?No results found for: VITAMINB12 ?No results found for: TSH ? ? ?08/03/21 CTA head / neck ?1. No acute intracranial abnormality. ?2. No aneurysm, AVM or dural AV fistula. ?3. No high-grade stenosis of the major neck or intracranial arteries. ?4. Mild luminal irregularity of the cervical internal carotid ?arteries and vertebral arteries, as described above, concerning for ?possible vessel disease such as fibromuscular dysplasia. ? ? ? ?ASSESSMENT AND PLAN ? ?46 y.o. year old female here with intermittent symptoms including up and down vertigo sensation, numbness on the left hand and foot, heart racing sensation,  headaches intermittently since 2022. ? ? ?Dx: ? ?1. Numbness   ?2. Excessive daytime sleepiness   ?3. Snoring   ? ? ? ? ?PLAN: ? ?NUMBNESS, HEADACHES, VERTIGO (eval for demyelinating dz; consider migraine also)

## 2021-08-22 ENCOUNTER — Telehealth: Payer: Self-pay

## 2021-08-22 LAB — HEMOGLOBIN A1C
Est. average glucose Bld gHb Est-mCnc: 117 mg/dL
Hgb A1c MFr Bld: 5.7 % — ABNORMAL HIGH (ref 4.8–5.6)

## 2021-08-22 LAB — VITAMIN B12: Vitamin B-12: 308 pg/mL (ref 232–1245)

## 2021-08-22 NOTE — Telephone Encounter (Signed)
Contacted pt, informed her labs are unremarkable, no concerns. Advised to call office back with questions as she had none at the time and was appreciative.  ?

## 2021-08-22 NOTE — Telephone Encounter (Signed)
-----   Message from Penni Bombard, MD sent at 08/22/2021  8:47 AM EDT ----- ?Unremarkable labs. Continue current plan. Please call patient. -VRP ?

## 2021-09-03 ENCOUNTER — Telehealth: Payer: Self-pay | Admitting: Diagnostic Neuroimaging

## 2021-09-03 NOTE — Telephone Encounter (Signed)
[employer] order sent to GI, they will obtain the auth and reach out to the patient to schedule.  ?

## 2021-09-11 ENCOUNTER — Encounter: Payer: Self-pay | Admitting: Neurology

## 2021-09-11 ENCOUNTER — Ambulatory Visit (INDEPENDENT_AMBULATORY_CARE_PROVIDER_SITE_OTHER): Payer: Managed Care, Other (non HMO) | Admitting: Neurology

## 2021-09-11 VITALS — BP 119/76 | HR 63 | Ht 67.0 in | Wt 286.2 lb

## 2021-09-11 DIAGNOSIS — R0683 Snoring: Secondary | ICD-10-CM

## 2021-09-11 DIAGNOSIS — R519 Headache, unspecified: Secondary | ICD-10-CM | POA: Diagnosis not present

## 2021-09-11 DIAGNOSIS — Z820 Family history of epilepsy and other diseases of the nervous system: Secondary | ICD-10-CM | POA: Diagnosis not present

## 2021-09-11 DIAGNOSIS — G4719 Other hypersomnia: Secondary | ICD-10-CM | POA: Diagnosis not present

## 2021-09-11 DIAGNOSIS — R0681 Apnea, not elsewhere classified: Secondary | ICD-10-CM

## 2021-09-11 NOTE — Progress Notes (Signed)
Subjective:  ?  ?Patient ID: Melinda Ward is a 46 y.o. female. ? ?HPI ? ? ? ?Huston Foley, MD, PhD ?[facility] ?[address], [address] ?[phone] ?[place], [place] [address] ? ?Dear Melinda Ward, ? ?I saw your patient, Melinda Ward, upon your kind request in my sleep clinic today for initial consultation of her sleep disorder, in particular, concern for underlying obstructive sleep apnea.  The patient is unaccompanied today.  As you know, Melinda Ward is a 46 year old right-handed woman with an underlying medical history of paresthesias, recurrent headaches, hypertension, and severe obesity with a BMI of over 40, who reports snoring and excessive daytime somnolence as well as morning headaches.  I reviewed your office note from 08/21/2021.  She had previous sleep testing through Dr. Stacy Ward.  I was able to review her home sleep test report from study dated 06/09/2019.  Overall AHI was 1.8/h, lowest desaturation 78%.  Time below 85% saturation was 1.4 minutes.  Her Epworth sleepiness score is 11 out of 24, fatigue severity score is 37 out of 63.  She works for [employer] as a Naval architect.  Her first sleep study was recommended because she was a Naval architect.  She reports that she did not sleep well at home at the time.  She has been noted by her partner to have pauses or irregularities in her breathing to where they check on her sometimes.  She is single and lives alone, no children.  She has 1 dog in the household.  She has caffeine in the form of tea and soda, soda about 3 bottles and occasional tea, she is working on caffeine reduction.  She drinks alcohol very occasionally, typically on special occasion, she is a non-smoker.  She has woken up with a sense of choking.  She works from 11:15 AM to 9:30 PM or 9:45 PM.  She is generally home by 10 or 10:30 PM.  She is typically in bed by 1130 but may not be asleep till later even as late as 1 AM or so.  Wake time is 8 AM.  She is  typically awake before then, around 6:30 AM.  She does not have night to night nocturia but does have recurrent morning headaches.  She is working on weight loss.  She is currently no longer on phentermine, this was through her previous primary care. ? ?Her Past Medical History Is Significant For: ?Past Medical History:  ?Diagnosis Date  ? Headache   ? Hypertension   ? ? ?Her Past Surgical History Is Significant For: ?Past Surgical History:  ?Procedure Laterality Date  ? ABDOMINAL HYSTERECTOMY    ? ECTOPIC PREGNANCY SURGERY    ? FOOT SURGERY Right 05/2020  ? LYMPH NODE BIOPSY    ? TUBAL LIGATION    ? ? ?Her Family History Is Significant For: ?Family History  ?Problem Relation Age of Onset  ? Cancer Mother   ?     cervical  ? Sleep apnea Father   ? Sleep apnea Maternal Uncle   ? ? ?Her Social History Is Significant For: ?Social History  ? ?Socioeconomic History  ? Marital status: Single  ?  Spouse name: Not on file  ? Number of children: 0  ? Years of education: Not on file  ? Highest education level: Associate degree: academic program  ?Occupational History  ?  Comment: [employer]  ?Tobacco Use  ? Smoking status: Never  ? Smokeless tobacco: Never  ?Vaping Use  ? Vaping  Use: Never used  ?Substance and Sexual Activity  ? Alcohol use: Yes  ?  Alcohol/week: 2.0 standard drinks  ?  Types: 2 Glasses of wine per week  ?  Comment: SOCIAL  ? Drug use: Never  ? Sexual activity: Yes  ?Other Topics Concern  ? Not on file  ?Social History Narrative  ? Caffeine- sodas 2-3 a day, tea  ? ?Social Determinants of Health  ? ?Financial Resource Strain: Not on file  ?Food Insecurity: Not on file  ?Transportation Needs: Not on file  ?Physical Activity: Not on file  ?Stress: Not on file  ?Social Connections: Not on file  ? ? ?Her Allergies Are:  ?Allergies  ?Allergen Reactions  ? Latex Rash  ?:  ? ?Her Current Medications Are:  ?Outpatient Encounter Medications as of 09/11/2021  ?Medication Sig  ? amLODipine (NORVASC) 5 MG tablet Take 5 mg by  mouth daily.  ? ergocalciferol (VITAMIN D2) 1.25 MG (50000 UT) capsule Take 50,000 Units by mouth once a week.  ? hydrochlorothiazide (MICROZIDE) 12.5 MG capsule Take 12.5 mg by mouth daily.  ? ibuprofen (ADVIL) 800 MG tablet Take 1 tablet (800 mg total) by mouth every 6 (six) hours as needed.  ? omeprazole (PRILOSEC) 40 MG capsule Take 40 mg by mouth daily. prn  ? phentermine (ADIPEX-P) 37.5 MG tablet Take 37.5 mg by mouth every morning.  ? valACYclovir (VALTREX) 1000 MG tablet SMARTSIG:1 Tablet(s) By Mouth Every 12 Hours PRN  ? ?No facility-administered encounter medications on file as of 09/11/2021.  ?: ? ? ?Review of Systems:  ?Out of a complete 14 point review of systems, all are reviewed and negative with the exception of these symptoms as listed below: ? ?Review of Systems  ?Neurological:   ?     Pt is her sleep consult  Pt states she snores,fatigue, headaches ,hypertension. Pt has sleep study with her at appointment.Sleep study was done 06/09/2019. Pt has no CPAP at home  ? ?ESS:11 ?FSS:37 ?  ? ?Objective:  ?Neurological Exam ? ?Physical Exam ?Physical Examination:  ? ?Vitals:  ? 09/11/21 0908  ?BP: 119/76  ?Pulse: 63  ? ? ?General Examination: The patient is a very pleasant 46 y.o. female in no acute distress. She appears well-developed and well-nourished and well groomed.  ? ?HEENT: Normocephalic, atraumatic, pupils are equal, round and reactive to light, extraocular tracking is good without limitation to gaze excursion or nystagmus noted. Hearing is grossly intact. Face is symmetric with normal facial animation. Speech is clear with no dysarthria noted. There is no hypophonia. There is no lip, neck/head, jaw or voice tremor. Neck is supple with full range of passive and active motion. There are no carotid bruits on auscultation. Oropharynx exam reveals: mild mouth dryness, good dental hygiene and moderate airway crowding, due to tonsillar size of about 2+ on the left and 1+ on the right.  Mallampati class  II, wider tongue.  Minimal overbite.  Neck circumference of 16-1/2 inches.  Tongue protrudes centrally and palate elevates symmetrically. ? ?Chest: Clear to auscultation without wheezing, rhonchi or crackles noted. ? ?Heart: S1+S2+0, regular and normal without murmurs, rubs or gallops noted.  ? ?Abdomen: Soft, non-tender and non-distended. ? ?Extremities: There is no obvious edema.  ? ?Skin: Warm and dry without trophic changes noted.  ? ?Musculoskeletal: exam reveals no obvious joint deformities.  ? ?Neurologically:  ?Mental status: The patient is awake, alert and oriented in all 4 spheres. Her immediate and remote memory, attention, language skills and fund  of knowledge are appropriate. There is no evidence of aphasia, agnosia, apraxia or anomia. Speech is clear with normal prosody and enunciation. Thought process is linear. Mood is normal and affect is normal.  ?Cranial nerves II - XII are as described above under HEENT exam.  ?Motor exam: Normal bulk, strength and tone is noted. There is no obvious tremor. Fine motor skills and coordination: grossly intact.  ?Cerebellar testing: No dysmetria or intention tremor. There is no truncal or gait ataxia.  ?Sensory exam: intact to light touch in the upper and lower extremities.  ?Gait, station and balance: She stands easily. No veering to one side is noted. No leaning to one side is noted. Posture is age-appropriate and stance is narrow based. Gait shows normal stride length and normal pace. No problems turning are noted.  ? ?Assessment and Plan:  ?In summary, Melinda Ward is a very pleasant 46 y.o.-year old female with an underlying medical history of paresthesias, recurrent headaches, hypertension, and severe obesity with a BMI of over 40, whose history and physical exam are concerning for obstructive sleep apnea (OSA). ?I had a long chat with the patient about my findings and the diagnosis of OSA, its prognosis and treatment options. We talked about medical  treatments, surgical interventions and non-pharmacological approaches. I explained in particular the risks and ramifications of untreated moderate to severe OSA, especially with respect to developing card

## 2021-09-11 NOTE — Patient Instructions (Addendum)
Thank you for choosing [facility] for your sleep related care! It was nice to meet you today!   Here is what we discussed today:    Based on your symptoms and your exam I believe you are still at risk for obstructive sleep apnea (aka OSA). We should proceed with a sleep study to determine whether you do or do not have OSA and how severe it is. Even, if you have mild OSA, I may want you to consider treatment with CPAP, as treatment of even borderline or mild sleep apnea can result and improvement of symptoms such as sleep disruption, daytime sleepiness, nighttime bathroom breaks, restless leg symptoms, improvement of headache syndromes, even improved mood disorder.   As explained, an attended sleep study (meaning you get to stay overnight in the sleep lab), lets us monitor sleep-related behaviors such as sleep talking and leg movements in sleep, in addition to monitoring for sleep apnea.  A home sleep test is a screening tool for sleep apnea diagnosis only, but unfortunately, does not help with any other sleep-related diagnoses.  Please remember, the long-term risks and ramifications of untreated moderate to severe obstructive sleep apnea may include (but are not limited to): increased risk for cardiovascular disease, including congestive heart failure, stroke, difficult to control hypertension, treatment resistant obesity, arrhythmias, especially irregular heartbeat commonly known as A. Fib. (atrial fibrillation); even type 2 diabetes has been linked to untreated OSA.   Other correlations that untreated obstructive sleep apnea include macular edema which is swelling of the retina in the eyes, droopy eyelid syndrome, and elevated hemoglobin and hematocrit levels (often referred to as polycythemia).  Sleep apnea can cause disruption of sleep and sleep deprivation in most cases, which, in turn, can cause recurrent headaches, problems with memory, mood, concentration, focus, and  vigilance. Most people with untreated sleep apnea report excessive daytime sleepiness, which can affect their ability to drive. Please do not drive or use heavy equipment or machinery, if you feel sleepy! Patients with sleep apnea can also develop difficulty initiating and maintaining sleep (aka insomnia).   Having sleep apnea may increase your risk for other sleep disorders, including involuntary behaviors sleep such as sleep terrors, sleep talking, sleepwalking.    Having sleep apnea can also increase your risk for restless leg syndrome and leg movements at night.   Please note that untreated obstructive sleep apnea may carry additional perioperative morbidity. Patients with significant obstructive sleep apnea (typically, in the moderate to severe degree) should receive, if possible, perioperative PAP (positive airway pressure) therapy and the surgeons and particularly the anesthesiologists should be informed of the diagnosis and the severity of the sleep disordered breathing.   We will call you or email you through MyChart with regards to your test results and plan a follow-up in sleep clinic accordingly. Most likely, you will hear from one of our nurses.   Our sleep lab administrative assistant will call you to schedule your sleep study and give you further instructions, regarding the check in process for the sleep study, arrival time, what to bring, when you can expect to leave after the study, etc., and to answer any other logistical questions you may have. If you don't hear back from her by about 2 weeks from now, please feel free to call her direct line at [phone] or you can call our general clinic number, or email us through My Chart.   

## 2021-09-18 ENCOUNTER — Ambulatory Visit
Admission: RE | Admit: 2021-09-18 | Discharge: 2021-09-18 | Disposition: A | Discharge status: Home / Self Care | Payer: Managed Care, Other (non HMO) | Source: Ambulatory Visit | Attending: Diagnostic Neuroimaging | Admitting: Diagnostic Neuroimaging

## 2021-09-18 DIAGNOSIS — R200 Anesthesia of skin: Secondary | ICD-10-CM | POA: Diagnosis not present

## 2021-09-18 MED ORDER — GADOBENATE DIMEGLUMINE 529 MG/ML IV SOLN
20.0000 mL | Freq: Once | INTRAVENOUS | Status: AC | PRN
  Administered 2021-09-18: 20 mL via INTRAVENOUS

## 2021-09-24 ENCOUNTER — Other Ambulatory Visit: Payer: Self-pay | Admitting: Diagnostic Neuroimaging

## 2021-09-24 DIAGNOSIS — R200 Anesthesia of skin: Secondary | ICD-10-CM

## 2021-09-25 ENCOUNTER — Ambulatory Visit
Admission: RE | Admit: 2021-09-25 | Discharge: 2021-09-25 | Disposition: A | Discharge status: Home / Self Care | Payer: Managed Care, Other (non HMO) | Source: Ambulatory Visit | Attending: Diagnostic Neuroimaging | Admitting: Diagnostic Neuroimaging

## 2021-09-25 ENCOUNTER — Inpatient Hospital Stay: Admission: RE | Admit: 2021-09-25 | Payer: Managed Care, Other (non HMO) | Source: Ambulatory Visit

## 2021-09-25 DIAGNOSIS — R200 Anesthesia of skin: Secondary | ICD-10-CM | POA: Diagnosis not present

## 2021-09-25 MED ORDER — GADOBENATE DIMEGLUMINE 529 MG/ML IV SOLN
20.0000 mL | Freq: Once | INTRAVENOUS | Status: AC | PRN
  Administered 2021-09-25: 20 mL via INTRAVENOUS

## 2021-10-01 ENCOUNTER — Ambulatory Visit (INDEPENDENT_AMBULATORY_CARE_PROVIDER_SITE_OTHER): Payer: Managed Care, Other (non HMO) | Admitting: Neurology

## 2021-10-01 DIAGNOSIS — G4733 Obstructive sleep apnea (adult) (pediatric): Secondary | ICD-10-CM | POA: Diagnosis not present

## 2021-10-01 DIAGNOSIS — G4719 Other hypersomnia: Secondary | ICD-10-CM

## 2021-10-01 DIAGNOSIS — R0683 Snoring: Secondary | ICD-10-CM

## 2021-10-01 DIAGNOSIS — Z820 Family history of epilepsy and other diseases of the nervous system: Secondary | ICD-10-CM

## 2021-10-01 DIAGNOSIS — R0681 Apnea, not elsewhere classified: Secondary | ICD-10-CM

## 2021-10-01 DIAGNOSIS — R519 Headache, unspecified: Secondary | ICD-10-CM

## 2021-10-03 NOTE — Procedures (Signed)
? ?  [facility] ? ?HOME SLEEP TEST (Watch PAT) REPORT ? ?STUDY DATE: 10/01/2021 ? ?DOB: 1975/11/06 ? ?MRN: [id number] ? ?ORDERING CLINICIAN: Huston Foley, MD, PhD ?  ?REFERRING CLINICIAN: Dr. Etheleen Mayhew ? ?CLINICAL INFORMATION/HISTORY: 46 year old right-handed woman with an underlying medical history of paresthesias, recurrent headaches, hypertension, and severe obesity with a BMI of over 40, who reports snoring and excessive daytime somnolence as well as morning headaches.  ? ?Epworth sleepiness score: 11/24. ? ?BMI: 45 kg/m? ? ?FINDINGS:  ? ?Sleep Summary:  ? ?Total Recording Time (hours, min): 8 hours, 20 minutes ? ?Total Sleep Time (hours, min):  6 hours, 45 minutes  ? ?Percent REM (%):    24.8%  ? ?Respiratory Indices:  ? ?Calculated pAHI (per hour):  12.1/hour        ? ?REM pAHI:    29.5/hour      ? ?NREM pAHI: 6.4/hour ? ?Oxygen Saturation Statistics:  ?  ?Oxygen Saturation (%) Mean: 94%  ? ?Minimum oxygen saturation (%):                 86%  ? ?O2 Saturation Range (%): 86-98%   ? ?O2 Saturation (minutes) <=88%: 0.4 min ? ?Pulse Rate Statistics:  ? ?Pulse Mean (bpm):    70/min   ? ?Pulse Range (54-100/min)  ? ?IMPRESSION: OSA (obstructive sleep apnea), mild ? ?RECOMMENDATION:  ?This home sleep test demonstrates overall mild obstructive sleep apnea with a total AHI of 12.1/hour and O2 nadir of 86%. Given the patient's medical history and sleep related complaints, treatment with positive airway pressure is recommended. This can be achieved in the form of autoPAP trial/titration at home. A  full night CPAP titration study will help with proper treatment settings and mask fitting if needed, down the road. Alternative treatments include weight loss along with avoidance of the supine sleep position, or an oral appliance in appropriate candidates.   ?Please note that untreated obstructive sleep apnea may carry additional perioperative morbidity. Patients with significant obstructive sleep apnea  should receive perioperative PAP therapy and the surgeons and particularly the anesthesiologist should be informed of the diagnosis and the severity of the sleep disordered breathing. ?The patient should be cautioned not to drive, work at heights, or operate dangerous or heavy equipment when tired or sleepy. Review and reiteration of good sleep hygiene measures should be pursued with any patient. ?Other causes of the patient's symptoms, including circadian rhythm disturbances, an underlying mood disorder, medication effect and/or an underlying medical problem cannot be ruled out based on this test. Clinical correlation is recommended.  ? ?The patient and her referring provider will be notified of the test results. The patient will be seen in follow up in sleep clinic at [facility]. ? ?I certify that I have reviewed the raw data recording prior to the issuance of this report in accordance with the standards of the American Academy of Sleep Medicine (AASM). ? ?INTERPRETING PHYSICIAN:  ? ?Huston Foley, MD, PhD  ?Board Certified in Neurology and Sleep Medicine ? ?[facility] ?[address], [address] ?[place], [place] [address] ?[phone] ? ? ? ? ? ? ? ? ? ? ? ? ? ? ? ? ? ?

## 2021-10-03 NOTE — Progress Notes (Signed)
See procedure note.

## 2021-10-03 NOTE — Addendum Note (Signed)
Addended by: Huston Foley on: 10/03/2021 05:46 PM ? ? Modules accepted: Orders ? ?

## 2021-10-04 ENCOUNTER — Telehealth: Payer: Self-pay | Admitting: *Deleted

## 2021-10-04 NOTE — Telephone Encounter (Signed)
Spoke to patient gave Sleep study results will move forward with autoPAP . Pt chose [facility] for DME . Will  fax referral today to [facility] . Informed patient to be complaint with her insurance company she has to wear autopap 4 +hours a night. In order for insurance company  to pay for autopap and supplies. Pt expressed understanding . Did schedule initial CPAP follow up for July 19,2023 with Dr Rexene Alberts  Pt is aware to bring AutoPap and power cord to that visit . Did send refereeing MD ( Dr. Leta Baptist)  sleep study results  Pt thanked me for calling  ?

## 2021-10-04 NOTE — Telephone Encounter (Signed)
-----   Message from Star Age, MD sent at 10/03/2021  5:46 PM EDT ----- ?Patient referred by Dr. Leta Baptist, seen by me on 09/11/2021, HST 10/01/2021.  ? ?Please call and notify the patient that the recent home sleep test showed obstructive sleep apnea. OSA is overall mild, but worth treating to see if she feels better after treatment. To that end I recommend treatment for this in the form of autoPAP, which means, that we don't have to bring her in for a sleep study with CPAP, but will let her try an autoPAP machine at home, through a DME company (of her choice, or as per insurance requirement). The DME representative will educate her on how to use the machine, how to put the mask on, etc. I have placed an order in the chart. Please send referral, talk to patient, send report to referring MD. We will need a FU in sleep clinic for 10 weeks post-PAP set up, please arrange that with me or one of our NPs. Thanks,  ? ?Star Age, MD, PhD ?[facility]) ? ? ? ? ?

## 2021-10-15 DIAGNOSIS — Z0289 Encounter for other administrative examinations: Secondary | ICD-10-CM

## 2021-10-24 ENCOUNTER — Ambulatory Visit (INDEPENDENT_AMBULATORY_CARE_PROVIDER_SITE_OTHER): Payer: Managed Care, Other (non HMO) | Admitting: Bariatrics

## 2021-10-24 ENCOUNTER — Encounter (INDEPENDENT_AMBULATORY_CARE_PROVIDER_SITE_OTHER): Payer: Self-pay | Admitting: Bariatrics

## 2021-10-24 VITALS — BP 116/80 | HR 79 | Temp 97.7°F | Ht 67.0 in | Wt 284.0 lb

## 2021-10-24 DIAGNOSIS — R0602 Shortness of breath: Secondary | ICD-10-CM

## 2021-10-24 DIAGNOSIS — Z1331 Encounter for screening for depression: Secondary | ICD-10-CM

## 2021-10-24 DIAGNOSIS — R5383 Other fatigue: Secondary | ICD-10-CM

## 2021-10-24 DIAGNOSIS — K219 Gastro-esophageal reflux disease without esophagitis: Secondary | ICD-10-CM

## 2021-10-24 DIAGNOSIS — R7303 Prediabetes: Secondary | ICD-10-CM

## 2021-10-24 DIAGNOSIS — Z9189 Other specified personal risk factors, not elsewhere classified: Secondary | ICD-10-CM

## 2021-10-24 DIAGNOSIS — I10 Essential (primary) hypertension: Secondary | ICD-10-CM | POA: Diagnosis not present

## 2021-10-24 DIAGNOSIS — E559 Vitamin D deficiency, unspecified: Secondary | ICD-10-CM

## 2021-10-24 DIAGNOSIS — G4733 Obstructive sleep apnea (adult) (pediatric): Secondary | ICD-10-CM | POA: Diagnosis not present

## 2021-10-24 DIAGNOSIS — Z6841 Body Mass Index (BMI) 40.0 and over, adult: Secondary | ICD-10-CM

## 2021-10-24 DIAGNOSIS — E8881 Metabolic syndrome: Secondary | ICD-10-CM

## 2021-10-24 DIAGNOSIS — E668 Other obesity: Secondary | ICD-10-CM

## 2021-10-25 ENCOUNTER — Encounter (INDEPENDENT_AMBULATORY_CARE_PROVIDER_SITE_OTHER): Payer: Self-pay | Admitting: Bariatrics

## 2021-10-25 DIAGNOSIS — E559 Vitamin D deficiency, unspecified: Secondary | ICD-10-CM | POA: Insufficient documentation

## 2021-10-25 LAB — TSH+T4F+T3FREE
Free T4: 1.12 ng/dL (ref 0.82–1.77)
T3, Free: 3.3 pg/mL (ref 2.0–4.4)
TSH: 1.86 u[IU]/mL (ref 0.450–4.500)

## 2021-10-25 LAB — LIPID PANEL WITH LDL/HDL RATIO
Cholesterol, Total: 193 mg/dL (ref 100–199)
HDL: 84 mg/dL (ref >39)
LDL Chol Calc (NIH): 99 mg/dL (ref 0–99)
LDL/HDL Ratio: 1.2 ratio (ref 0.0–3.2)
Triglycerides: 53 mg/dL (ref 0–149)
VLDL Cholesterol Cal: 10 mg/dL (ref 5–40)

## 2021-10-25 LAB — INSULIN, RANDOM: INSULIN: 5.5 u[IU]/mL (ref 2.6–24.9)

## 2021-10-25 LAB — VITAMIN D 25 HYDROXY (VIT D DEFICIENCY, FRACTURES): Vit D, 25-Hydroxy: 24.4 ng/mL — ABNORMAL LOW (ref 30.0–100.0)

## 2021-10-31 NOTE — Progress Notes (Unsigned)
Chief Complaint:   OBESITY Melinda Ward (MR# [id number]) is a 46 y.o. female who presents for evaluation and treatment of obesity and related comorbidities. Current BMI is Body mass index is 44.48 kg/m. [name] has been struggling with her weight for many years and has been unsuccessful in either losing weight, maintaining weight loss, or reaching her healthy weight goal.  [name] does like to cook, but notes lack of energy as an obstacle. She snacks on carbohydrates. She considers herself a "picky eater".   [name] is currently in the action stage of change and ready to dedicate time achieving and maintaining a healthier weight. [name] is interested in becoming our patient and working on intensive lifestyle modifications including (but not limited to) diet and exercise for weight loss.  [name]'s habits were reviewed today and are as follows: she struggles with family and or coworkers weight loss sabotage, her desired weight loss is 99-104 pounds, she has been heavy most of her life, she started gaining weight when she started on the Depo shot, her heaviest weight ever was 298 pounds, she is a picky eater and doesn't like to eat healthier foods, she has significant food cravings issues, she snacks frequently in the evenings, she wakes up frequently in the middle of the night to eat, she skips meals frequently, she is frequently drinking liquids with calories, she frequently makes poor food choices, she has problems with excessive hunger, she frequently eats larger portions than normal, and she struggles with emotional eating.  Depression Screen [name]'s Food and Mood (modified PHQ-9) score was 19.     10/24/2021    8:18 AM  Depression screen PHQ 2/9  Decreased Interest 3  Down, Depressed, Hopeless 1  PHQ - 2 Score 4  Altered sleeping 3  Tired, decreased energy 3  Change in appetite 3  Feeling bad or failure about yourself  3  Trouble concentrating 2  Moving  slowly or fidgety/restless 1  Suicidal thoughts 0  PHQ-9 Score 19  Difficult doing work/chores Very difficult   Subjective:   1. Other fatigue [name] admits to daytime somnolence and admits to waking up still tired. Patient has a history of symptoms of daytime fatigue, morning fatigue, and morning headache. [name] generally gets 6 hours of sleep per night, and states that she has difficulty falling back asleep if awakened. Snoring is present. Apneic episodes is present. Epworth Sleepiness Score is 10.  [name] will continue activities.   2. SOB (shortness of breath) on exertion [name] notes increasing shortness of breath with exercising and seems to be worsening over time with weight gain. She notes getting out of breath sooner with activity than she used to. This has not gotten worse recently. [name] denies shortness of breath at rest or orthopnea.   3. Pre-diabetes [name] is not on medications currently. Her last A1C was 5.7.  4. Essential hypertension Mikki is taking amlodipine currently.   5. Gastroesophageal reflux disease without esophagitis [name] is taking over the counter famotidine medication occasionally. She states it is a mild case of GERD.   6. OSA (obstructive sleep apnea) [name] is using her CPAP nightly.   7. Vitamin D deficiency [name] is taking prescription Vitamin D.   8. Insulin resistance [name] is not on medications.   9. At risk for diabetes mellitus [name] is at risk for diabetes mellitus due to a history of pre-diabetes and a family history of diabetes mellitus type 2.    Assessment/Plan:   1. Other fatigue [name]  does feel that her weight is causing her energy to be lower than it should be. Fatigue may be related to obesity, depression or many other causes. Labs will be ordered, and in the meanwhile, [name] will focus on self care including making healthy food choices, increasing physical activity  and focusing on stress reduction. [name] will gradually increase activities. We will check Thyroid panel today. We will review EKG today.   - EKG 12-Lead - TSH+T4F+T3Free  2. SOB (shortness of breath) on exertion [name] does feel that she gets out of breath more easily that she used to when she exercises. [name]'s shortness of breath appears to be obesity related and exercise induced. She has agreed to work on weight loss and gradually increase exercise to treat her exercise induced shortness of breath. Will continue to monitor closely.   - TSH+T4F+T3Free  3. Pre-diabetes [name] will continue to work on weight loss, exercise, and decreasing simple carbohydrates to help decrease the risk of diabetes. We will check insulin today.   - Insulin, random  4. Essential hypertension [name] will continue her medications. We will check lipids today. She is working on healthy weight loss and exercise to improve blood pressure control. We will watch for signs of hypotension as she continues her lifestyle modifications.  - Lipid Panel With LDL/HDL Ratio  5. Gastroesophageal reflux disease without esophagitis Intensive lifestyle modifications are the first line treatment for this issue. We discussed several lifestyle modifications today and she will continue to work on diet, exercise and weight loss efforts. Lyrika will continue over the counter medications. Orders and follow up as documented in patient record.   Counseling If a person has gastroesophageal reflux disease (GERD), food and stomach acid move back up into the esophagus and cause symptoms or problems such as damage to the esophagus. Anti-reflux measures include: raising the head of the bed, avoiding tight clothing or belts, avoiding eating late at night, not lying down shortly after mealtime, and achieving weight loss. Avoid ASA, NSAID's, caffeine, alcohol, and tobacco.  OTC Pepcid and/or Tums are often very helpful  for as needed use.  However, for persisting chronic or daily symptoms, stronger medications like Omeprazole may be needed. You may need to avoid foods and drinks such as: Coffee and tea (with or without caffeine). Drinks that contain alcohol. Energy drinks and sports drinks. Bubbly (carbonated) drinks or sodas. Chocolate and cocoa. Peppermint and mint flavorings. Garlic and onions. Horseradish. Spicy and acidic foods. These include peppers, chili powder, curry powder, vinegar, hot sauces, and BBQ sauce. Citrus fruit juices and citrus fruits, such as oranges, lemons, and limes. Tomato-based foods. These include red sauce, chili, salsa, and pizza with red sauce. Fried and fatty foods. These include donuts, french fries, potato chips, and high-fat dressings. High-fat meats. These include hot dogs, rib eye steak, sausage, ham, and bacon.   6. OSA (obstructive sleep apnea) Intensive lifestyle modifications are the first line treatment for this issue. We discussed several lifestyle modifications today and she will continue to work on diet, exercise and weight loss efforts. Tykisha will continue to use her CPAP nightly. We will continue to monitor. Orders and follow up as documented in patient record.    7. Vitamin D deficiency Low Vitamin D level contributes to fatigue and are associated with obesity, breast, and colon cancer. We will check Vitamin D and Coumba will follow-up for routine testing of Vitamin D, at least 2-3 times per year to avoid over-replacement.  - VITAMIN D 25 Hydroxy (Vit-D  Deficiency, Fractures)  8. Insulin resistance Floy will continue to work on weight loss, exercise, and decreasing simple carbohydrates to help decrease the risk of diabetes. Laycee agreed to follow-up with us as directed to closely monitor her progress. We will check insulin today.   - Insulin, random  9. Depression screening Dinah had a positive depression screening. Depression  is commonly associated with obesity and often results in emotional eating behaviors. We will monitor this closely and work on CBT to help improve the non-hunger eating patterns. Referral to Psychology may be required if no improvement is seen as she continues in our clinic.   10. At risk for diabetes mellitus Huyen was given approximately 15 minutes of diabetic education and counseling today. We discussed intensive lifestyle modifications today with an emphasis on weight loss as well as increasing exercise and decreasing simple carbohydrates in her diet. We also reviewed medication options with an emphasis on risk versus benefits of those discussed.  Repetitive spaced learning was employed today to elicit superior memory formation and behavioral change.   10. Class 3 severe obesity with serious comorbidity and body mass index (BMI) of 40.0 to 44.9 in adult, unspecified obesity type (HCC) Shruti is currently in the action stage of change and her goal is to continue with weight loss efforts. I recommend Pietra begin the structured treatment plan as follows:  She has agreed to the Category 3 Plan and keeping a food journal and adhering to recommended goals of 1300 calories and 85 grams of protein plus 100 calories. She has taken phentermine in the past with side effects.   Laneta SimmersJacquelene will continue meal planning. She will eat healthier snacks. We reviewed labs from 08/21/2021 A1C and B 12. 08/03/2021 CMP.   Exercise goals: No exercise has been prescribed at this time.   Behavioral modification strategies: increasing lean protein intake, decreasing simple carbohydrates, increasing vegetables, increasing water intake, decreasing eating out, no skipping meals, meal planning and cooking strategies, keeping healthy foods in the home, and planning for success.  She was informed of the importance of frequent follow-up visits to maximize her success with intensive lifestyle modifications for her  multiple health conditions. She was informed we would discuss her lab results at her next visit unless there is a critical issue that needs to be addressed sooner. Megham agreed to keep her next visit at the agreed upon time to discuss these results.  Objective:   Blood pressure 116/80, pulse 79, temperature 97.7 F (36.5 C), height 5\' 7"  (1.702 m), weight 284 lb (128.8 kg), last menstrual period 04/04/2018, SpO2 97 %. Body mass index is 44.48 kg/m.  EKG: Normal sinus rhythm, rate unable to obtain.  Indirect Calorimeter completed today shows a VO2 of 264 and a REE of 1814.  Her calculated basal metabolic rate is 16102023 thus her basal metabolic rate is worse than expected.  General: Cooperative, alert, well developed, in no acute distress. HEENT: Conjunctivae and lids unremarkable. Cardiovascular: Regular rhythm.  Lungs: Normal work of breathing. Neurologic: No focal deficits.   Lab Results  Component Value Date   CREATININE 0.73 08/03/2021   BUN 11 08/03/2021   NA 139 08/03/2021   K 3.4 (L) 08/03/2021   CL 105 08/03/2021   CO2 25 08/03/2021   Lab Results  Component Value Date   ALT 11 08/03/2021   AST 14 (L) 08/03/2021   ALKPHOS 78 08/03/2021   BILITOT 0.4 08/03/2021   Lab Results  Component Value Date   HGBA1C 5.7 (H)  08/21/2021   Lab Results  Component Value Date   INSULIN 5.5 10/24/2021   Lab Results  Component Value Date   TSH 1.860 10/24/2021   Lab Results  Component Value Date   CHOL 193 10/24/2021   HDL 84 10/24/2021   LDLCALC 99 10/24/2021   TRIG 53 10/24/2021   Lab Results  Component Value Date   WBC 4.4 08/03/2021   HGB 12.2 08/03/2021   HCT 37.9 08/03/2021   MCV 87.1 08/03/2021   PLT 313 08/03/2021   No results found for: IRON, TIBC, FERRITIN  Attestation Statements:   Reviewed by clinician on day of visit: allergies, medications, problem list, medical history, surgical history, family history, social history, and previous encounter  notes.  I, Jackson Latino, RMA, am acting as Energy manager for [employer], DO.  I have reviewed the above documentation for accuracy and completeness, and I agree with the above. Corinna Capra, DO

## 2021-11-01 ENCOUNTER — Encounter (INDEPENDENT_AMBULATORY_CARE_PROVIDER_SITE_OTHER): Payer: Self-pay | Admitting: Bariatrics

## 2021-11-07 ENCOUNTER — Ambulatory Visit (INDEPENDENT_AMBULATORY_CARE_PROVIDER_SITE_OTHER): Payer: Managed Care, Other (non HMO) | Admitting: Bariatrics

## 2021-11-07 ENCOUNTER — Telehealth (INDEPENDENT_AMBULATORY_CARE_PROVIDER_SITE_OTHER): Payer: Self-pay

## 2021-11-07 ENCOUNTER — Encounter (INDEPENDENT_AMBULATORY_CARE_PROVIDER_SITE_OTHER): Payer: Self-pay | Admitting: Bariatrics

## 2021-11-07 VITALS — BP 122/81 | HR 65 | Temp 97.7°F | Ht 67.0 in | Wt 281.0 lb

## 2021-11-07 DIAGNOSIS — Z6841 Body Mass Index (BMI) 40.0 and over, adult: Secondary | ICD-10-CM | POA: Diagnosis not present

## 2021-11-07 DIAGNOSIS — R7303 Prediabetes: Secondary | ICD-10-CM | POA: Diagnosis not present

## 2021-11-07 DIAGNOSIS — E65 Localized adiposity: Secondary | ICD-10-CM

## 2021-11-07 DIAGNOSIS — E559 Vitamin D deficiency, unspecified: Secondary | ICD-10-CM | POA: Diagnosis not present

## 2021-11-07 DIAGNOSIS — E669 Obesity, unspecified: Secondary | ICD-10-CM

## 2021-11-07 NOTE — Telephone Encounter (Signed)
error 

## 2021-11-07 NOTE — Progress Notes (Signed)
Chief Complaint:   OBESITY Melinda Ward is here to discuss her progress with her obesity treatment plan along with follow-up of her obesity related diagnoses. Melinda Ward is on the Category 3 Plan and keeping a food journal and adhering to recommended goals of 1300 calories and 85 grams of  protein and states she is following her eating plan approximately 90% of the time. Melinda Ward states she is doing 0 minutes 0 times per week.  Today's visit was #: 2 Starting weight: 284 lbs Starting date: 10/24/2021 Today's weight: 281 lbs Today's date: 11/07/2021 Total lbs lost to date: 3 lbs Total lbs lost since last in-office visit: 3 lbs  Interim History: Melinda Ward is down 3 lbs since her first visit. She states that it was a struggle. She is drinking adequate water.   Subjective:   1. Vitamin D deficiency Melinda Ward is taking high dose Vitamin D. Her last Vitamin D level was 24.4.  2. Pre-diabetes Melinda Ward is not on medications currently. Her A1C was 5.7 on 08/21/2021. Her insulin was 5.5 on 10/24/2021.  3. Visceral obesity Melinda Ward's Visceral fat was 16. It is now 15.  Assessment/Plan:   1. Vitamin D deficiency Low Vitamin D level contributes to fatigue and are associated with obesity, breast, and colon cancer. Melinda Ward agrees to  continue Vitamin D 50,000 IU every week and she will follow-up for routine testing of Vitamin D, at least 2-3 times per year to avoid over-replacement.  2. Pre-diabetes Melinda Ward will keep sugars and starches low. She will continue to work on weight loss, exercise, and decreasing simple carbohydrates to help decrease the risk of diabetes.   3. Visceral obesity We will follow over time.   4. Obesity, Current BMI 44.0 Melinda Ward is currently in the action stage of change. As such, her goal is to continue with weight loss efforts. She has agreed to the Category 3 Plan and keeping a food journal and adhering to recommended goals of 1300-1500 calories and  85 grams of protein.   Melinda Ward will continue meal planning.She will continue to adhere closely to the plan. We reviewed labs from 10/24/2021 Lipid panel, Vitamin D, insulin, and thyroid panel. She will got to [facility] for produce. She will eat healthy snacks.   Exercise goals:  Melinda Ward will stay active at work.   Behavioral modification strategies: increasing lean protein intake, decreasing simple carbohydrates, increasing vegetables, increasing water intake, decreasing eating out, no skipping meals, meal planning and cooking strategies, keeping healthy foods in the home, and planning for success.  Melinda Ward has agreed to follow-up with our clinic in 2-3 weeks. She was informed of the importance of frequent follow-up visits to maximize her success with intensive lifestyle modifications for her multiple health conditions.   Objective:   Blood pressure 122/81, pulse 65, temperature 97.7 F (36.5 C), height 5\' 7"  (1.702 m), weight 281 lb (127.5 kg), last menstrual period 04/04/2018, SpO2 98 %. Body mass index is 44.01 kg/m.  General: Cooperative, alert, well developed, in no acute distress. HEENT: Conjunctivae and lids unremarkable. Cardiovascular: Regular rhythm.  Lungs: Normal work of breathing. Neurologic: No focal deficits.   Lab Results  Component Value Date   CREATININE 0.73 08/03/2021   BUN 11 08/03/2021   NA 139 08/03/2021   K 3.4 (L) 08/03/2021   CL 105 08/03/2021   CO2 25 08/03/2021   Lab Results  Component Value Date   ALT 11 08/03/2021   AST 14 (L) 08/03/2021   ALKPHOS 78 08/03/2021   BILITOT 0.4  08/03/2021   Lab Results  Component Value Date   HGBA1C 5.7 (H) 08/21/2021   Lab Results  Component Value Date   INSULIN 5.5 10/24/2021   Lab Results  Component Value Date   TSH 1.860 10/24/2021   Lab Results  Component Value Date   CHOL 193 10/24/2021   HDL 84 10/24/2021   LDLCALC 99 10/24/2021   TRIG 53 10/24/2021   Lab Results  Component Value Date    VD25OH 24.4 (L) 10/24/2021   Lab Results  Component Value Date   WBC 4.4 08/03/2021   HGB 12.2 08/03/2021   HCT 37.9 08/03/2021   MCV 87.1 08/03/2021   PLT 313 08/03/2021   No results found for: IRON, TIBC, FERRITIN  Attestation Statements:   Reviewed by clinician on day of visit: allergies, medications, problem list, medical history, surgical history, family history, social history, and previous encounter notes.  I, Jackson Latino, RMA, am acting as Energy manager for [employer], DO.  I have reviewed the above documentation for accuracy and completeness, and I agree with the above. Corinna Capra, DO

## 2021-11-13 ENCOUNTER — Encounter (INDEPENDENT_AMBULATORY_CARE_PROVIDER_SITE_OTHER): Payer: Self-pay | Admitting: Bariatrics

## 2021-11-24 ENCOUNTER — Other Ambulatory Visit: Payer: Self-pay

## 2021-11-24 ENCOUNTER — Emergency Department (HOSPITAL_BASED_OUTPATIENT_CLINIC_OR_DEPARTMENT_OTHER): Payer: Managed Care, Other (non HMO)

## 2021-11-24 ENCOUNTER — Emergency Department (HOSPITAL_BASED_OUTPATIENT_CLINIC_OR_DEPARTMENT_OTHER)
Admission: EM | Admit: 2021-11-24 | Discharge: 2021-11-24 | Disposition: A | Discharge status: Home / Self Care | Payer: Managed Care, Other (non HMO) | Attending: Emergency Medicine | Admitting: Emergency Medicine

## 2021-11-24 ENCOUNTER — Encounter (HOSPITAL_BASED_OUTPATIENT_CLINIC_OR_DEPARTMENT_OTHER): Payer: Self-pay | Admitting: Emergency Medicine

## 2021-11-24 DIAGNOSIS — I10 Essential (primary) hypertension: Secondary | ICD-10-CM | POA: Diagnosis not present

## 2021-11-24 DIAGNOSIS — S6701XA Crushing injury of right thumb, initial encounter: Secondary | ICD-10-CM

## 2021-11-24 DIAGNOSIS — W230XXA Caught, crushed, jammed, or pinched between moving objects, initial encounter: Secondary | ICD-10-CM | POA: Insufficient documentation

## 2021-11-24 DIAGNOSIS — Z23 Encounter for immunization: Secondary | ICD-10-CM | POA: Diagnosis not present

## 2021-11-24 DIAGNOSIS — S62521A Displaced fracture of distal phalanx of right thumb, initial encounter for closed fracture: Secondary | ICD-10-CM

## 2021-11-24 DIAGNOSIS — S6991XA Unspecified injury of right wrist, hand and finger(s), initial encounter: Secondary | ICD-10-CM | POA: Diagnosis present

## 2021-11-24 MED ORDER — OXYCODONE-ACETAMINOPHEN 5-325 MG PO TABS
1.0000 | ORAL_TABLET | Freq: Once | ORAL | Status: AC
  Administered 2021-11-24: 1 via ORAL
  Filled 2021-11-24: qty 1

## 2021-11-24 MED ORDER — ONDANSETRON 4 MG PO TBDP
4.0000 mg | ORAL_TABLET | Freq: Once | ORAL | Status: AC
  Administered 2021-11-24: 4 mg via ORAL
  Filled 2021-11-24: qty 1

## 2021-11-24 MED ORDER — CEPHALEXIN 500 MG PO CAPS: 500.0000 mg | ORAL_CAPSULE | Freq: Two times a day (BID) | ORAL | 0 refills | Status: AC

## 2021-11-24 MED ORDER — IBUPROFEN 800 MG PO TABS: 800.0000 mg | ORAL_TABLET | Freq: Four times a day (QID) | ORAL | 0 refills | Status: DC | PRN

## 2021-11-24 MED ORDER — TETANUS-DIPHTH-ACELL PERTUSSIS 5-2.5-18.5 LF-MCG/0.5 IM SUSY
0.5000 mL | PREFILLED_SYRINGE | Freq: Once | INTRAMUSCULAR | Status: AC
Start: 2021-11-24 — End: 2021-11-24
  Administered 2021-11-24: 0.5 mL via INTRAMUSCULAR
  Filled 2021-11-24: qty 0.5

## 2021-11-24 NOTE — ED Provider Notes (Signed)
Emergency Department Provider Note   I have reviewed the triage vital signs and the nursing notes.   HISTORY  Chief Complaint Finger Injury (Right 1st finger)   HPI Melinda Ward is a 46 y.o. female presents emergency department with smash injury to the right thumb.  Patient works at [employer] and was closing to double doors when the edges of the door smashed in the DIP joint.  She is having pain at the base of the nail but not the tip of the finger or nailbed itself.  She does have acrylic nails on and so cannot comment on bruising.  She has not had bleeding from under the nail.  Not having any numbness to the fingertip.  Unsure of her last tetanus shot.    Past Medical History:  Diagnosis Date   Acid reflux    Back pain    Carpal tunnel syndrome    Headache    High cholesterol    Hypertension    Pre-diabetes    Sleep apnea    Swelling of lower extremity    Vitamin D deficiency     Review of Systems  Constitutional: No fever/chills Cardiovascular: Denies chest pain. Respiratory: Denies shortness of breath. Gastrointestinal: No abdominal pain.  No nausea, no vomiting.  No diarrhea.  No constipation. Musculoskeletal: Right thumb injury.  Skin: Bleeding from the base of the nail.  Neurological: Negative for numbness.    ____________________________________________   PHYSICAL EXAM:  VITAL SIGNS: ED Triage Vitals  Enc Vitals Group     BP 11/24/21 1659 (!) 143/92     Pulse Rate 11/24/21 1659 73     Resp 11/24/21 1659 20     Temp 11/24/21 1659 98.4 F (36.9 C)     Temp src --      SpO2 11/24/21 1659 100 %     Weight 11/24/21 1654 279 lb 15.8 oz (127 kg)     Height 11/24/21 1654 5\' 7"  (1.702 m)    Constitutional: Alert and oriented. Well appearing and in no acute distress. Eyes: Conjunctivae are normal.  Head: Atraumatic. Nose: No congestion/rhinnorhea. Mouth/Throat: Mucous membranes are moist.  Neck: No stridor. Cardiovascular: Normal rate, regular  rhythm. Good peripheral circulation.  Respiratory: Normal respiratory effort.  Gastrointestinal:  No distention.  Musculoskeletal: Patient with normal range of motion of the right thumb with tenderness mainly over the DIP joint.  No deep laceration.  There is some superficial skin breakdown at the base of the nailbed.  She has no tenderness over the nailbed itself.  Neurologic:  Normal speech and language. No gross focal neurologic deficits are appreciated.  Skin:  Skin is warm. Mild bleeding at the base of the nail.   ____________________________________________   PROCEDURES  Procedure(s) performed:   Procedures  None ____________________________________________   INITIAL IMPRESSION / ASSESSMENT AND PLAN / ED COURSE  Pertinent labs & imaging results that were available during my care of the patient were reviewed by me and considered in my medical decision making (see chart for details).   This patient is Presenting for Evaluation of finger injury, which does require a range of treatment options, and is a complaint that involves a high risk of morbidity and mortality.  The Differential Diagnoses include fracture, dislocation, subungual hematoma, laceration.  Critical Interventions-    Medications  oxyCODONE-acetaminophen (PERCOCET/ROXICET) 5-325 MG per tablet 1 tablet (1 tablet Oral Given 11/24/21 1733)  Tdap (BOOSTRIX) injection 0.5 mL (0.5 mLs Intramuscular Given 11/24/21 1734)  ondansetron (ZOFRAN-ODT) disintegrating tablet  4 mg (4 mg Oral Given 11/24/21 1733)    Reassessment after intervention: Pain improved.    I did obtain Additional Historical Information from friend at bedside.   Radiologic Tests Ordered, included right thumb x-ray. I independently interpreted the images and agree with radiology interpretation.    Medical Decision Making: Summary:  Patient presents emergency department with a right nail/finger injury.  There does not appear to be, at least on  initial exam, significant nailbed injury.  I cannot assess for subungual hematoma due to the acrylic nail but the nail is firmly adherent to the nailbed and there is no bleeding from underneath the nail.  There is some pain mainly over the DIP joint in the right thumb.  Plan for plain films and reassess.  Reevaluation with update and discussion with patient. Discussed dressing changes and hand surgery follow up. X ray with distal non-displaced fx. Attempted nail removal with acetone but patient is having some discomfort with soaking the finger. Nail appears firmly adherent on exam. Cannot assess subungual hematoma but no severe pain at this time. Discussed strict ED return precautions.    Disposition: discharge  ____________________________________________  FINAL CLINICAL IMPRESSION(S) / ED DIAGNOSES  Final diagnoses:  Crush injury to thumb, right, initial encounter  Closed fracture of tuft of distal phalanx of right thumb     NEW OUTPATIENT MEDICATIONS STARTED DURING THIS VISIT:  Discharge Medication List as of 11/24/2021  7:40 PM     START taking these medications   Details  cephALEXin (KEFLEX) 500 MG capsule Take 1 capsule (500 mg total) by mouth 2 (two) times daily for 5 days., Starting Sat 11/24/2021, Until Thu 11/29/2021, Normal        Note:  This document was prepared using Dragon voice recognition software and may include unintentional dictation errors.  Alona Bene, MD, [facility] Emergency Medicine    Alayshia Marini, Arlyss Repress, MD 11/28/21 [phone]

## 2021-11-28 ENCOUNTER — Ambulatory Visit (INDEPENDENT_AMBULATORY_CARE_PROVIDER_SITE_OTHER): Payer: Managed Care, Other (non HMO) | Admitting: Bariatrics

## 2021-11-28 ENCOUNTER — Encounter (INDEPENDENT_AMBULATORY_CARE_PROVIDER_SITE_OTHER): Payer: Self-pay | Admitting: Bariatrics

## 2021-11-28 VITALS — BP 113/71 | HR 74 | Temp 98.0°F | Ht 67.0 in | Wt 282.0 lb

## 2021-11-28 DIAGNOSIS — E669 Obesity, unspecified: Secondary | ICD-10-CM | POA: Diagnosis not present

## 2021-11-28 DIAGNOSIS — Z6841 Body Mass Index (BMI) 40.0 and over, adult: Secondary | ICD-10-CM

## 2021-11-28 DIAGNOSIS — E8881 Metabolic syndrome: Secondary | ICD-10-CM

## 2021-11-29 ENCOUNTER — Ambulatory Visit (INDEPENDENT_AMBULATORY_CARE_PROVIDER_SITE_OTHER): Payer: No Typology Code available for payment source | Admitting: Orthopedic Surgery

## 2021-11-29 DIAGNOSIS — S62521A Displaced fracture of distal phalanx of right thumb, initial encounter for closed fracture: Secondary | ICD-10-CM

## 2021-11-29 HISTORY — DX: Displaced fracture of distal phalanx of right thumb, initial encounter for closed fracture: S62.521A
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
## 2021-12-03 ENCOUNTER — Encounter (INDEPENDENT_AMBULATORY_CARE_PROVIDER_SITE_OTHER): Payer: Self-pay | Admitting: Bariatrics

## 2021-12-03 NOTE — Progress Notes (Signed)
Chief Complaint:   OBESITY Melinda Ward is here to discuss her progress with her obesity treatment plan along with follow-up of her obesity related diagnoses. Melinda Ward is on the Category 3 Plan or keeping a food journal and adhering to recommended goals of 1300-1500 calories and 85 grams of protein daily and states she is following her eating plan approximately 50% of the time. Melinda Ward states she is doing 0 minutes 0 times per week.  Today's visit was #: 3 Starting weight: 284 lbs Starting date: 10/24/2021 Today's weight: 282 lbs Today's date: 11/28/2021 Total lbs lost to date: 2 Total lbs lost since last in-office visit: 0  Interim History: Melinda Ward is up 1 pound, but she smashed her finger in the car door and she has been in pain.  She has in orthopedic appointment coming up.  Subjective:   1. Insulin resistance Melinda Ward is not on medications.   Assessment/Plan:   1. Insulin resistance Melinda Ward will follow the plan 80-90%. She will keep all carbohydrates low.   2. Obesity, Current BMI 44.2 Melinda Ward is currently in the action stage of change. As such, her goal is to continue with weight loss efforts. She has agreed to keeping a food journal and adhering to recommended goals of 1300 calories and 85 grams of protein daily.   Melinda Ward will adhere more closely to the plan 80-90%.  Exercise goals: No exercise has been prescribed at this time.  Behavioral modification strategies: increasing lean protein intake, decreasing simple carbohydrates, increasing vegetables, increasing water intake, decreasing eating out, no skipping meals, meal planning and cooking strategies, keeping healthy foods in the home, and planning for success.  Melinda Ward has agreed to follow-up with our clinic in 2 to 3 weeks. She was informed of the importance of frequent follow-up visits to maximize her success with intensive lifestyle modifications for her multiple health conditions.    Objective:   Blood pressure 113/71, pulse 74, temperature 98 F (36.7 C), height 5\' 7"  (1.702 m), weight 282 lb (127.9 kg), last menstrual period 04/04/2018, SpO2 98 %. Body mass index is 44.17 kg/m.  General: Cooperative, alert, well developed, in no acute distress. HEENT: Conjunctivae and lids unremarkable. Cardiovascular: Regular rhythm.  Lungs: Normal work of breathing. Neurologic: No focal deficits.   Lab Results  Component Value Date   CREATININE 0.73 08/03/2021   BUN 11 08/03/2021   NA 139 08/03/2021   K 3.4 (L) 08/03/2021   CL 105 08/03/2021   CO2 25 08/03/2021   Lab Results  Component Value Date   ALT 11 08/03/2021   AST 14 (L) 08/03/2021   ALKPHOS 78 08/03/2021   BILITOT 0.4 08/03/2021   Lab Results  Component Value Date   HGBA1C 5.7 (H) 08/21/2021   Lab Results  Component Value Date   INSULIN 5.5 10/24/2021   Lab Results  Component Value Date   TSH 1.860 10/24/2021   Lab Results  Component Value Date   CHOL 193 10/24/2021   HDL 84 10/24/2021   LDLCALC 99 10/24/2021   TRIG 53 10/24/2021   Lab Results  Component Value Date   VD25OH 24.4 (L) 10/24/2021   Lab Results  Component Value Date   WBC 4.4 08/03/2021   HGB 12.2 08/03/2021   HCT 37.9 08/03/2021   MCV 87.1 08/03/2021   PLT 313 08/03/2021   No results found for: "IRON", "TIBC", "FERRITIN"  Attestation Statements:   Reviewed by clinician on day of visit: allergies, medications, problem list, medical history, surgical history, family  history, social history, and previous encounter notes.   Trude Mcburney, am acting as Energy manager for [employer], DO.  I have reviewed the above documentation for accuracy and completeness, and I agree with the above. Corinna Capra, DO

## 2021-12-06 ENCOUNTER — Encounter: Payer: Self-pay | Admitting: Radiology

## 2021-12-06 ENCOUNTER — Telehealth: Payer: Self-pay | Admitting: Orthopedic Surgery

## 2021-12-06 NOTE — Telephone Encounter (Signed)
Please extend the Work Note -and place it on Mychart--as the appt was moved, and the work note was thru 7-13

## 2021-12-06 NOTE — Telephone Encounter (Signed)
Note sent to her Mychart to keep her out of till her follow up appt 12/18/21

## 2021-12-13 ENCOUNTER — Ambulatory Visit: Payer: Managed Care, Other (non HMO) | Admitting: Orthopedic Surgery

## 2021-12-17 ENCOUNTER — Ambulatory Visit: Payer: Managed Care, Other (non HMO) | Admitting: Orthopedic Surgery

## 2021-12-18 ENCOUNTER — Ambulatory Visit (INDEPENDENT_AMBULATORY_CARE_PROVIDER_SITE_OTHER): Payer: No Typology Code available for payment source | Admitting: Orthopedic Surgery

## 2021-12-18 ENCOUNTER — Ambulatory Visit (INDEPENDENT_AMBULATORY_CARE_PROVIDER_SITE_OTHER): Payer: No Typology Code available for payment source

## 2021-12-18 ENCOUNTER — Encounter: Payer: Self-pay | Admitting: Neurology

## 2021-12-18 DIAGNOSIS — S62521A Displaced fracture of distal phalanx of right thumb, initial encounter for closed fracture: Secondary | ICD-10-CM
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
## 2021-12-19 ENCOUNTER — Ambulatory Visit (INDEPENDENT_AMBULATORY_CARE_PROVIDER_SITE_OTHER): Payer: Managed Care, Other (non HMO) | Admitting: Bariatrics

## 2021-12-19 ENCOUNTER — Ambulatory Visit: Payer: Managed Care, Other (non HMO) | Admitting: Neurology

## 2021-12-19 ENCOUNTER — Encounter (INDEPENDENT_AMBULATORY_CARE_PROVIDER_SITE_OTHER): Payer: Self-pay | Admitting: Bariatrics

## 2021-12-19 VITALS — BP 121/77 | HR 82 | Temp 97.9°F | Ht 67.0 in | Wt 282.0 lb

## 2021-12-19 DIAGNOSIS — Z6841 Body Mass Index (BMI) 40.0 and over, adult: Secondary | ICD-10-CM

## 2021-12-19 DIAGNOSIS — E559 Vitamin D deficiency, unspecified: Secondary | ICD-10-CM

## 2021-12-19 DIAGNOSIS — E65 Localized adiposity: Secondary | ICD-10-CM

## 2021-12-19 DIAGNOSIS — E668 Other obesity: Secondary | ICD-10-CM

## 2021-12-19 MED ORDER — ERGOCALCIFEROL 1.25 MG (50000 UT) PO CAPS: 50000.0000 [IU] | ORAL_CAPSULE | ORAL | 0 refills | Status: DC

## 2021-12-20 ENCOUNTER — Ambulatory Visit (INDEPENDENT_AMBULATORY_CARE_PROVIDER_SITE_OTHER): Payer: Managed Care, Other (non HMO) | Admitting: Neurology

## 2021-12-20 ENCOUNTER — Encounter: Payer: Self-pay | Admitting: Neurology

## 2021-12-20 VITALS — BP 117/73 | HR 63 | Ht 67.0 in | Wt 286.0 lb

## 2021-12-20 DIAGNOSIS — G4733 Obstructive sleep apnea (adult) (pediatric): Secondary | ICD-10-CM

## 2021-12-20 DIAGNOSIS — Z9989 Dependence on other enabling machines and devices: Secondary | ICD-10-CM | POA: Diagnosis not present

## 2021-12-20 NOTE — Patient Instructions (Signed)
Please continue using your autoPAP regularly. While your insurance requires that you use PAP at least 4 hours each night on 70% of the nights, I recommend, that you not skip any nights and use it throughout the night if you can. Getting used to PAP and staying with the treatment long term does take time and patience and discipline. Untreated obstructive sleep apnea when it is moderate to severe can have an adverse impact on cardiovascular health and raise her risk for heart disease, arrhythmias, hypertension, congestive heart failure, stroke and diabetes. Untreated obstructive sleep apnea causes sleep disruption, nonrestorative sleep, and sleep deprivation. This can have an impact on your day to day functioning and cause daytime sleepiness and impairment of cognitive function, memory loss, mood disturbance, and problems focussing. Using PAP regularly can improve these symptoms. You are almost there with your compliance, keep up the good work!  You can email [place] in about a month so we can review another compliance download from your machine remotely.  You do not have to bring the machine next time, follow-up routinely to see one of our nurse practitioners in this clinic for sleep apnea in 6 months and hopefully yearly thereafter.

## 2021-12-20 NOTE — Progress Notes (Signed)
Subjective:    Patient ID: Melinda Ward is a 46 y.o. female.  HPI    Interim history:   Melinda Ward is a 46 year old right-handed woman with an underlying medical history of paresthesias, recurrent headaches, hypertension, and severe obesity with a BMI of over 46, who Presents for follow-up consultation of her obstructive sleep apnea after interim testing and starting AutoPap therapy.  The patient is accompanied by her friend today.  I first met her at the request of Melinda Ward on 09/11/2021, at which time she reported snoring and excessive daytime somnolence as well as morning headaches.  She was advised to proceed with sleep testing.  She had a home sleep test on 10/01/2021 which showed an AHI of 12.1/h, O2 nadir 86%.  He was advised to proceed with AutoPap therapy.  Her set up date was 10/19/2021.  She has a [employer] air sense 11 AutoSet machine.  Today, 12/20/2021: I reviewed her AutoPap compliance data from 11/19/2021 through 12/18/2021, which is a total of 30 days, during which time she used her machine 27 days with percent use days greater than 4 hours at 67%, indicating slightly suboptimal compliance, average usage of 5 hours and 56 minutes, residual AHI at goal at 0.6/h, average pressure for the 95th percentile at 9.2 cm with a range of 5 to 11 cm with EPR of 1, leak on the higher side with the 95th percentile at 20.8 L/min.  In the past 60 days her compliance altogether is a little bit less, percent use days greater than 4 hours at 62%, average usage of 5 hours and 15 minutes.  She reports doing a little better after initial issues with her mask.  The first mask was a nasal mask which was not tolerated.  She had a runny nose, she woke up sneezing, she then started using a N30i nasal pillows interface from [employer] but the size medium was irritating her nostrils and caused a sore under her nose.  She switched to the small and this is much better tolerated.  However, the headgear slides off  often, more so recently, she has been in touch with her to provide her for a new headgear, she has changed the insert.  She has also changed the filter.  She is not quite sure if she has benefited from treatment as yet, she certainly does not feel any worse.  She is motivated to continue with treatment.  Her Epworth sleepiness score is 7/24, previously was 11.  She is currently out of work since 11/24/2021 due to a right thumb injury.  She admits that she has been able to go to bed a little earlier because of not having to keep her work schedule for now.  Previously:   09/11/21: (She) reports snoring and excessive daytime somnolence as well as morning headaches.  I reviewed your office note from 08/21/2021.  She had previous sleep testing through Melinda Ward.  I was able to review her home sleep test report from study dated 06/09/2019.  Overall AHI was 1.8/h, lowest desaturation 78%.  Time below 85% saturation was 1.4 minutes.  Her Epworth sleepiness score is 11 out of 24, fatigue severity score is 37 out of 63.  She works for [employer] as a Administrator.  Her first sleep study was recommended because she was a Administrator.  She reports that she did not sleep well at home at the time.  She has been noted by her partner to have pauses or irregularities in  her breathing to where they check on her sometimes.  She is single and lives alone, no children.  She has 1 dog in the household.  She has caffeine in the form of tea and soda, soda about 3 bottles and occasional tea, she is working on caffeine reduction.  She drinks alcohol very occasionally, typically on special occasion, she is a non-smoker.  She has woken up with a sense of choking.  She works from 11:15 AM to 9:30 PM or 9:45 PM.  She is generally home by 10 or 10:30 PM.  She is typically in bed by 1130 but may not be asleep till later even as late as 1 AM or so.  Wake time is 8 AM.  She is typically awake before then, around 6:30 AM.  She does not have night  to night nocturia but does have recurrent morning headaches.  She is working on weight loss.  She is currently no longer on phentermine, this was through her previous primary care.    Her Past Medical History Is Significant For: Past Medical History:  Diagnosis Date   Acid reflux    Back pain    Carpal tunnel syndrome    Dysphasia    Headache    High cholesterol    Hypertension    Pre-diabetes    Sleep apnea    Swelling of lower extremity    Vitamin D deficiency     Her Past Surgical History Is Significant For: Past Surgical History:  Procedure Laterality Date   ABDOMINAL HYSTERECTOMY     ECTOPIC PREGNANCY SURGERY     FOOT SURGERY Right 05/2020   LYMPH NODE BIOPSY     TUBAL LIGATION      Her Family History Is Significant For: Family History  Problem Relation Age of Onset   Cancer Mother        cervical   High blood pressure Mother    Thyroid disease Mother    Sleep apnea Father    Sudden death Father    Cancer Father    Sleep apnea Maternal Uncle     Her Social History Is Significant For: Social History   Socioeconomic History   Marital status: Single    Spouse name: Not on file   Number of children: 0   Years of education: Not on file   Highest education level: Associate degree: academic program  Occupational History    Comment: [employer]   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  Tobacco Use   Smoking status: Never   Smokeless tobacco: Never  Scientific laboratory technician Use: Never used  Substance and Sexual Activity   Alcohol use: Not Currently    Comment: occ   Drug use: Never   Sexual activity: Yes  Other Topics Concern   Not on file  Social History Narrative   Caffeine- sodas 2-3 a day, tea   Social Determinants of Health   Financial Resource Strain: Not on file  Food Insecurity: Not on file  Transportation Needs: Not on file  Physical Activity: Not on file  Stress: Not on file  Social Connections: Not on file    Her Allergies Are:  Allergies  Allergen Reactions    Latex Rash   Wound Dressing Adhesive Other (See Comments)    Burns skin   :   Her Current Medications Are:  Outpatient Encounter Medications as of 12/20/2021  Medication Sig   amLODipine (NORVASC) 5 MG tablet Take 5 mg by mouth daily.   ergocalciferol (VITAMIN  D2) 1.25 MG (50000 UT) capsule Take 1 capsule (50,000 Units total) by mouth once a week.   hydrochlorothiazide (MICROZIDE) 12.5 MG capsule Take 12.5 mg by mouth daily.   ibuprofen (ADVIL) 800 MG tablet Take 1 tablet (800 mg total) by mouth every 6 (six) hours as needed for moderate pain.   omeprazole (PRILOSEC) 40 MG capsule Take 40 mg by mouth daily. prn   valACYclovir (VALTREX) 1000 MG tablet SMARTSIG:1 Tablet(s) By Mouth Every 12 Hours PRN   phentermine (ADIPEX-P) 37.5 MG tablet Take 37.5 mg by mouth every morning.   No facility-administered encounter medications on file as of 12/20/2021.  :  Review of Systems:  Out of a complete 14 point review of systems, all are reviewed and negative with the exception of these symptoms as listed below:       Review of Systems  Neurological:        Pt here for CPAP follow up  Pt states mask still comes off . Pt states she tries to wars 4 hours nightly   ESS:7     Objective:  Neurological Exam  Physical Exam Physical Examination:   Vitals:   12/20/21 0735  BP: 117/73  Pulse: 63    General Examination: The patient is a very pleasant 46 y.o. female in no acute distress. She appears well-developed and well-nourished and well groomed.   HEENT: Normocephalic, atraumatic, pupils are equal, round and reactive to light, extraocular tracking is good without limitation to gaze excursion or nystagmus noted. Hearing is grossly intact. Face is symmetric with normal facial animation. Speech is clear with no dysarthria, hypophonia. There is no lip, neck/head, jaw or voice tremor. Neck is supple with full range of passive and active motion. There are no carotid bruits on auscultation.  Oropharynx exam reveals: No significant mouth dryness, good dental hygiene and moderate airway crowding.  Tongue protrudes centrally and palate elevates symmetrically.   Chest: Clear to auscultation without wheezing, rhonchi or crackles noted.   Heart: S1+S2+0, regular and normal without murmurs, rubs or gallops noted.    Abdomen: Soft, non-tender and non-distended.   Extremities: There is no pitting edema in the distal lower extremities bilaterally.    Skin: Warm and dry without trophic changes noted.    Musculoskeletal: exam reveals no obvious joint deformities.    Neurologically:  Mental status: The patient is awake, alert and oriented in all 4 spheres. Her immediate and remote memory, attention, language skills and fund of knowledge are appropriate. There is no evidence of aphasia, agnosia, apraxia or anomia. Speech is clear with normal prosody and enunciation. Thought process is linear. Mood is normal and affect is normal.  Cranial nerves II - XII are as described above under HEENT exam.  Motor exam: Normal bulk, strength and tone is noted. There is no obvious tremor. Fine motor skills and coordination: grossly intact.  Cerebellar testing: No dysmetria or intention tremor. There is no truncal or gait ataxia.  Sensory exam: intact to light touch in the upper and lower extremities.  Gait, station and balance: She stands easily. No veering to one side is noted. No leaning to one side is noted. Posture is age-appropriate and stance is narrow based. Gait shows normal stride length and normal pace. No problems turning are noted.    Assessment and Plan:  In summary, Amberley Hamler is a very pleasant 47 year old female with an underlying medical history of paresthesias, recurrent headaches, hypertension, and severe obesity with a BMI of over 40, who  presents for follow-up consultation of her obstructive sleep apnea, which was deemed in the mild range by home sleep testing on 10/01/2021, AHI  was 12.1/h, O2 nadir 86%.  She has established treatment with AutoPap therapy since 10/19/2021 and has improved her compliance, apnea scores are at goal, she has had initial adjustment challenges with respect to the interface but tolerates the pressure quite well, and is certainly motivated to continue with treatment.  She is commended for her treatment adherence thus far but is advised to try to be consistent with the AutoPap, she is encouraged to talk to her DME about getting a new headgear as it is not as snug as before.  She has been in touch with and at this juncture can follow-up in about 6 months to see one of our nurse practitioners.  She is encouraged to let us know in about a month how things are going via [employer] and we can certainly review another download remotely at the time.  I answered all her questions today and she was with our plan.  We reviewed her home sleep test results and reviewed her compliance data in detail today.  I spent 30 minutes in total face-to-face time and in reviewing records during pre-charting, more than 50% of which was spent in counseling and coordination of care, reviewing test results, reviewing medications and treatment regimen and/or in discussing or reviewing the diagnosis of OSA, the prognosis and treatment options. Pertinent laboratory and imaging test results that were available during this visit with the patient were reviewed by me and considered in my medical decision making (see chart for details).

## 2021-12-25 NOTE — Progress Notes (Signed)
Chief Complaint:   OBESITY Melinda Ward is here to discuss her progress with her obesity treatment plan along with follow-up of her obesity related diagnoses. Melinda Ward is on keeping a food journal and adhering to recommended goals of 1300 calories and 85 grams of protein and states she is following her eating plan approximately 75% of the time. Melinda Ward states she is doing 0 minutes 0 times per week.  Today's visit was #: 4 Starting weight: 284 lbs Starting date: 10/24/2021 Today's weight: 282 lbs Today's date: 12/19/2021 Total lbs lost to date: 2 Total lbs lost since last in-office visit: 0  Interim History: Melinda Ward weight remains the same since her last visit.  Things are going slow.  She is getting adequate protein and water and.  Her body water weight is up about 1 pound.  Subjective:   1. Vitamin D deficiency Melinda Ward is taking vitamin D.  2. Visceral obesity Melinda Ward's current visceral fat reading is 15, previously at 16.  Assessment/Plan:   1. Vitamin D deficiency Melinda Ward will continue prescription vitamin D 50,000 units once weekly, we will refill for 1 month.  - ergocalciferol (VITAMIN D2) 1.25 MG (50000 UT) capsule; Take 1 capsule (50,000 Units total) by mouth once a week.  Dispense: 4 capsule; Refill: 0  2. Visceral obesity Melinda Ward will continue to work on her meal plan and exercise.  3. Obesity, Current BMI 44.2 Melinda Ward is currently in the action stage of change. As such, her goal is to continue with weight loss efforts. She has agreed to keeping a food journal and adhering to recommended goals of 1300 calories and 85 grams of protein.   She will adhere closely to the plan.  Mindful eating was discussed.  Exercise goals: No exercise has been prescribed at this time.  Behavioral modification strategies: increasing lean protein intake, decreasing simple carbohydrates, increasing vegetables, increasing water intake, decreasing eating out, no  skipping meals, meal planning and cooking strategies, keeping healthy foods in the home, and planning for success.  Melinda Ward has agreed to follow-up with our clinic in 2 to 3 weeks. She was informed of the importance of frequent follow-up visits to maximize her success with intensive lifestyle modifications for her multiple health conditions.   Objective:   Blood pressure 121/77, pulse 82, temperature 97.9 F (36.6 C), height 5\' 7"  (1.702 m), weight 282 lb (127.9 kg), last menstrual period 04/04/2018, SpO2 94 %. Body mass index is 44.17 kg/m.  General: Cooperative, alert, well developed, in no acute distress. HEENT: Conjunctivae and lids unremarkable. Cardiovascular: Regular rhythm.  Lungs: Normal work of breathing. Neurologic: No focal deficits.   Lab Results  Component Value Date   CREATININE 0.73 08/03/2021   BUN 11 08/03/2021   NA 139 08/03/2021   K 3.4 (L) 08/03/2021   CL 105 08/03/2021   CO2 25 08/03/2021   Lab Results  Component Value Date   ALT 11 08/03/2021   AST 14 (L) 08/03/2021   ALKPHOS 78 08/03/2021   BILITOT 0.4 08/03/2021   Lab Results  Component Value Date   HGBA1C 5.7 (H) 08/21/2021   Lab Results  Component Value Date   INSULIN 5.5 10/24/2021   Lab Results  Component Value Date   TSH 1.860 10/24/2021   Lab Results  Component Value Date   CHOL 193 10/24/2021   HDL 84 10/24/2021   LDLCALC 99 10/24/2021   TRIG 53 10/24/2021   Lab Results  Component Value Date   VD25OH 24.4 (L) 10/24/2021  Lab Results  Component Value Date   WBC 4.4 08/03/2021   HGB 12.2 08/03/2021   HCT 37.9 08/03/2021   MCV 87.1 08/03/2021   PLT 313 08/03/2021   No results found for: "IRON", "TIBC", "FERRITIN"  Attestation Statements:   Reviewed by clinician on day of visit: allergies, medications, problem list, medical history, surgical history, family history, social history, and previous encounter notes.   Trude Mcburney, am acting as Energy manager for  [employer], DO.  I have reviewed the above documentation for accuracy and completeness, and I agree with the above. Corinna Capra, DO

## 2021-12-26 ENCOUNTER — Encounter (INDEPENDENT_AMBULATORY_CARE_PROVIDER_SITE_OTHER): Payer: Self-pay | Admitting: Bariatrics

## 2022-01-02 ENCOUNTER — Telehealth (INDEPENDENT_AMBULATORY_CARE_PROVIDER_SITE_OTHER): Payer: Managed Care, Other (non HMO) | Admitting: Family Medicine

## 2022-01-02 ENCOUNTER — Encounter (INDEPENDENT_AMBULATORY_CARE_PROVIDER_SITE_OTHER): Payer: Self-pay | Admitting: Family Medicine

## 2022-01-02 DIAGNOSIS — Z6841 Body Mass Index (BMI) 40.0 and over, adult: Secondary | ICD-10-CM | POA: Diagnosis not present

## 2022-01-02 DIAGNOSIS — F439 Reaction to severe stress, unspecified: Secondary | ICD-10-CM

## 2022-01-02 DIAGNOSIS — I10 Essential (primary) hypertension: Secondary | ICD-10-CM | POA: Diagnosis not present

## 2022-01-02 DIAGNOSIS — E669 Obesity, unspecified: Secondary | ICD-10-CM | POA: Diagnosis not present

## 2022-01-02 MED ORDER — HYDROCHLOROTHIAZIDE 12.5 MG PO CAPS: 12.5000 mg | ORAL_CAPSULE | Freq: Every day | ORAL | 0 refills | Status: DC

## 2022-01-04 ENCOUNTER — Ambulatory Visit (INDEPENDENT_AMBULATORY_CARE_PROVIDER_SITE_OTHER): Payer: No Typology Code available for payment source

## 2022-01-04 ENCOUNTER — Encounter: Payer: Self-pay | Admitting: Orthopedic Surgery

## 2022-01-04 ENCOUNTER — Ambulatory Visit (INDEPENDENT_AMBULATORY_CARE_PROVIDER_SITE_OTHER): Payer: No Typology Code available for payment source | Admitting: Orthopedic Surgery

## 2022-01-04 DIAGNOSIS — S62521A Displaced fracture of distal phalanx of right thumb, initial encounter for closed fracture: Secondary | ICD-10-CM | POA: Diagnosis not present
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
## 2022-01-08 ENCOUNTER — Ambulatory Visit: Payer: Managed Care, Other (non HMO) | Admitting: Orthopedic Surgery

## 2022-01-09 ENCOUNTER — Encounter (INDEPENDENT_AMBULATORY_CARE_PROVIDER_SITE_OTHER): Payer: Self-pay

## 2022-01-13 NOTE — Progress Notes (Signed)
TeleHealth Visit:  Due to the COVID-19 pandemic, this visit was completed with telemedicine (audio/video) technology to reduce patient and provider exposure as well as to preserve personal protective equipment.   Melinda Ward has verbally consented to this TeleHealth visit. The patient is located at home, the provider is located at the [facility] office. The participants in this visit include the listed provider and patient. The visit was conducted today via MyChart video.   Chief Complaint: OBESITY Melinda Ward is here to discuss her progress with her obesity treatment plan along with follow-up of her obesity related diagnoses. Melinda Ward is on keeping a food journal and adhering to recommended goals of 1300 calories and 85+ grams of protein daily and states she is following her eating plan approximately 90% of the time. Melinda Ward states she is doing chair exercise, and using weight ball for 15 minutes 3 times per week.  Today's visit was #: 5 Starting weight: 284 lbs Starting date: 10/24/2021  Interim History: Melinda Ward is working on some protein and being mindful of her calories.  She is working on decreasing soda and high calorie foods.  She is frustrated with her weight loss struggles.  She skips meals at times due to decreased hunger, but this may be decreasing her RMR.  Subjective:   1. Hypertension, unspecified type Melinda Ward's pressure is stable on her diet and with weight loss.  2. Stress Melinda Ward she is a stress eater.  She feels she is doing well overall with decreasing stress eating, but sometimes she skips meals.  Assessment/Plan:   1. Hypertension, unspecified type Melinda Ward agreed to increase the hydrochlorothiazide to 25 mg once daily, with no refills.   - hydrochlorothiazide (HYDRODIURIL) 25 MG capsule; Take 1 capsule (25 mg total) by mouth daily.  Dispense: 30 capsule; Refill: 0  2. Stress Melinda Ward was encouraged to see her food as her  medicine, and as such she needs to remember to get proper nutrition.  3. Obesity, Current BMI 44.2 Melinda Ward is currently in the action stage of change. As such, her goal is to continue with weight loss efforts. She has agreed to keeping a food journal and adhering to recommended goals of 1300 calories and 85+ grams of protein daily.   Behavioral modification strategies: decreasing simple carbohydrates and no skipping meals.  Melinda Ward has agreed to follow-up with our clinic in 2 weeks. She was informed of the importance of frequent follow-up visits to maximize her success with intensive lifestyle modifications for her multiple health conditions.  Objective:   VITALS: Per patient if applicable, see vitals. GENERAL: Alert and in no acute distress. CARDIOPULMONARY: No increased WOB. Speaking in clear sentences.  PSYCH: Pleasant and cooperative. Speech normal rate and rhythm. Affect is appropriate. Insight and judgement are appropriate. Attention is focused, linear, and appropriate.  NEURO: Oriented as arrived to appointment on time with no prompting.   Lab Results  Component Value Date   CREATININE 0.73 08/03/2021   BUN 11 08/03/2021   NA 139 08/03/2021   K 3.4 (L) 08/03/2021   CL 105 08/03/2021   CO2 25 08/03/2021   Lab Results  Component Value Date   ALT 11 08/03/2021   AST 14 (L) 08/03/2021   ALKPHOS 78 08/03/2021   BILITOT 0.4 08/03/2021   Lab Results  Component Value Date   HGBA1C 5.7 (H) 08/21/2021   Lab Results  Component Value Date   INSULIN 5.5 10/24/2021   Lab Results  Component Value Date   TSH 1.860 10/24/2021  Lab Results  Component Value Date   CHOL 193 10/24/2021   HDL 84 10/24/2021   LDLCALC 99 10/24/2021   TRIG 53 10/24/2021   Lab Results  Component Value Date   VD25OH 24.4 (L) 10/24/2021   Lab Results  Component Value Date   WBC 4.4 08/03/2021   HGB 12.2 08/03/2021   HCT 37.9 08/03/2021   MCV 87.1 08/03/2021   PLT 313 08/03/2021   No  results found for: "IRON", "TIBC", "FERRITIN"  Attestation Statements:   Reviewed by clinician on day of visit: allergies, medications, problem list, medical history, surgical history, family history, social history, and previous encounter notes.   I, Burt Knack, am acting as transcriptionist for Quillian Quince, MD.  I have reviewed the above documentation for accuracy and completeness, and I agree with the above. - Quillian Quince, MD

## 2022-01-15 ENCOUNTER — Ambulatory Visit: Payer: Managed Care, Other (non HMO) | Admitting: Orthopedic Surgery

## 2022-01-16 ENCOUNTER — Encounter (INDEPENDENT_AMBULATORY_CARE_PROVIDER_SITE_OTHER): Payer: Self-pay | Admitting: Family Medicine

## 2022-01-16 ENCOUNTER — Ambulatory Visit (INDEPENDENT_AMBULATORY_CARE_PROVIDER_SITE_OTHER): Payer: Managed Care, Other (non HMO) | Admitting: Family Medicine

## 2022-01-16 VITALS — BP 125/84 | HR 80 | Temp 98.7°F | Ht 67.0 in | Wt 282.0 lb

## 2022-01-16 DIAGNOSIS — E559 Vitamin D deficiency, unspecified: Secondary | ICD-10-CM

## 2022-01-16 DIAGNOSIS — Z90710 Acquired absence of both cervix and uterus: Secondary | ICD-10-CM

## 2022-01-16 DIAGNOSIS — E669 Obesity, unspecified: Secondary | ICD-10-CM

## 2022-01-16 DIAGNOSIS — I10 Essential (primary) hypertension: Secondary | ICD-10-CM | POA: Diagnosis not present

## 2022-01-16 DIAGNOSIS — Z9989 Dependence on other enabling machines and devices: Secondary | ICD-10-CM

## 2022-01-16 DIAGNOSIS — G4733 Obstructive sleep apnea (adult) (pediatric): Secondary | ICD-10-CM | POA: Diagnosis not present

## 2022-01-16 DIAGNOSIS — Z6841 Body Mass Index (BMI) 40.0 and over, adult: Secondary | ICD-10-CM

## 2022-01-16 DIAGNOSIS — R7303 Prediabetes: Secondary | ICD-10-CM | POA: Diagnosis not present

## 2022-01-16 MED ORDER — METFORMIN HCL 500 MG PO TABS: 500.0000 mg | ORAL_TABLET | Freq: Two times a day (BID) | ORAL | 0 refills | Status: DC

## 2022-01-16 MED ORDER — ERGOCALCIFEROL 1.25 MG (50000 UT) PO CAPS: 50000.0000 [IU] | ORAL_CAPSULE | ORAL | 0 refills | Status: DC

## 2022-01-17 MED ORDER — HYDROCHLOROTHIAZIDE 25 MG PO TABS: 25.0000 mg | ORAL_TABLET | Freq: Every day | ORAL | 0 refills | Status: DC

## 2022-01-25 ENCOUNTER — Ambulatory Visit (INDEPENDENT_AMBULATORY_CARE_PROVIDER_SITE_OTHER): Payer: No Typology Code available for payment source

## 2022-01-25 ENCOUNTER — Ambulatory Visit (INDEPENDENT_AMBULATORY_CARE_PROVIDER_SITE_OTHER): Payer: No Typology Code available for payment source | Admitting: Orthopedic Surgery

## 2022-01-25 ENCOUNTER — Encounter: Payer: Self-pay | Admitting: Orthopedic Surgery

## 2022-01-25 ENCOUNTER — Ambulatory Visit: Payer: Managed Care, Other (non HMO) | Admitting: Orthopedic Surgery

## 2022-01-25 DIAGNOSIS — S62521A Displaced fracture of distal phalanx of right thumb, initial encounter for closed fracture: Secondary | ICD-10-CM
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
## 2022-01-28 NOTE — Progress Notes (Signed)
Chief Complaint:   OBESITY Melinda Ward is here to discuss her progress with her obesity treatment plan along with follow-up of her obesity related diagnoses. Jamillia is on keeping a food journal and adhering to recommended goals of 1300 calories and 85+ grams of protein and states she is following her eating plan approximately 95% of the time. Myrtle states she is doing chair exercises with 2,5, and 10 lb dumbbells for 30 minutes 3 times per week.  Today's visit was #: 6 Starting weight: 284 lbs Starting date: 10/24/2021 Today's weight: 282 lbs Today's date: 01/16/2022 Total lbs lost to date: 2 Total lbs lost since last in-office visit: 0  Interim History: Melinda Ward's stress is high. She is on Worker's comp for right thumb fracture and she has been out of work at [employer]. She is still dealing with pain. She had a death in the family. She is trying to get meals in. She is reading labels for calories and protein. She denies hunger or cravings. She is still drinking sweet tea but she is off regular soda. IC was 1814 on 10/24/2021.  Subjective:   1. OSA on CPAP Candid's sleep has been limited lately, but she usually gets 8 hours of sleep.   2. Prediabetes Melinda Ward's last A1c was 5.7 on 08/21/2021. She has been trying to reduce her intake of sugar. She has room to increase walking time.   3. Benign essential HTN Chontel is holding amlodipine. She is on HCTZ 25 mg daily(not due for refill). She denies chest pain and her edema is improving.   4. Vitamin D deficiency Melinda Ward is on prescription Vitamin D 50,000 IU weekly. Her energy level is improving.  5. S/P TAH (total abdominal hysterectomy) Melinda Ward has ovaries intact and she denies vasomotor flushing. She is not sure if she has gone through menopause.   Assessment/Plan:   1. OSA on CPAP Tiamarie will continue CPAP nightly with 8 hours of sleep.   2. Prediabetes Melinda Ward agreed to start metformin 500 mg BID  with food, with no refills. We will recheck BMP and A1c levels in 4 weeks.   - metFORMIN (GLUCOPHAGE) 500 MG tablet; Take 1 tablet (500 mg total) by mouth 2 (two) times daily with a meal.  Dispense: 60 tablet; Refill: 0  3. Benign essential HTN Melinda Ward will continue HCTZ 25 mg daily.   4. Vitamin D deficiency We will refill prescription Vitamin D 50,000 IU weekly for 1 month, and we will recheck Vit D level in 4 weeks.   - ergocalciferol (VITAMIN D2) 1.25 MG (50000 UT) capsule; Take 1 capsule (50,000 Units total) by mouth once a week.  Dispense: 4 capsule; Refill: 0  5. S/P TAH (total abdominal hysterectomy) We will recheck FSH and LH levels with her next labs.   6. Obesity, Current BMI 44.3 Melinda Ward is currently in the action stage of change. As such, her goal is to continue with weight loss efforts. She has agreed to keeping a food journal and adhering to recommended goals of 1300-1500 calories and 100 grams of protein daily.    Changed to journaling with increased protein target.  Exercise goals: Track daily steps.  Behavioral modification strategies: increasing lean protein intake, increasing vegetables, increasing water intake, decreasing eating out, no skipping meals, meal planning and cooking strategies, and decreasing junk food.  Melinda Ward has agreed to follow-up with our clinic in 3 weeks. She was informed of the importance of frequent follow-up visits to maximize her success with intensive lifestyle modifications  for her multiple health conditions.   Objective:   Blood pressure 125/84, pulse 80, temperature 98.7 F (37.1 C), height 5\' 7"  (1.702 m), weight 282 lb (127.9 kg), last menstrual period 04/04/2018, SpO2 96 %. Body mass index is 44.17 kg/m.  General: Cooperative, alert, well developed, in no acute distress. HEENT: Conjunctivae and lids unremarkable. Cardiovascular: Regular rhythm.  Lungs: Normal work of breathing. Neurologic: No focal deficits.   Lab  Results  Component Value Date   CREATININE 0.73 08/03/2021   BUN 11 08/03/2021   NA 139 08/03/2021   K 3.4 (L) 08/03/2021   CL 105 08/03/2021   CO2 25 08/03/2021   Lab Results  Component Value Date   ALT 11 08/03/2021   AST 14 (L) 08/03/2021   ALKPHOS 78 08/03/2021   BILITOT 0.4 08/03/2021   Lab Results  Component Value Date   HGBA1C 5.7 (H) 08/21/2021   Lab Results  Component Value Date   INSULIN 5.5 10/24/2021   Lab Results  Component Value Date   TSH 1.860 10/24/2021   Lab Results  Component Value Date   CHOL 193 10/24/2021   HDL 84 10/24/2021   LDLCALC 99 10/24/2021   TRIG 53 10/24/2021   Lab Results  Component Value Date   VD25OH 24.4 (L) 10/24/2021   Lab Results  Component Value Date   WBC 4.4 08/03/2021   HGB 12.2 08/03/2021   HCT 37.9 08/03/2021   MCV 87.1 08/03/2021   PLT 313 08/03/2021   No results found for: "IRON", "TIBC", "FERRITIN"  Attestation Statements:   Reviewed by clinician on day of visit: allergies, medications, problem list, medical history, surgical history, family history, social history, and previous encounter notes.   10/03/2021, am acting as transcriptionist for Melinda Mcburney, DO  I have reviewed the above documentation for accuracy and completeness, and I agree with the above. [name], DO

## 2022-02-14 ENCOUNTER — Encounter (INDEPENDENT_AMBULATORY_CARE_PROVIDER_SITE_OTHER): Payer: Self-pay | Admitting: Family Medicine

## 2022-02-14 ENCOUNTER — Ambulatory Visit (INDEPENDENT_AMBULATORY_CARE_PROVIDER_SITE_OTHER): Payer: Managed Care, Other (non HMO) | Admitting: Family Medicine

## 2022-02-14 VITALS — BP 124/77 | HR 69 | Temp 98.2°F | Ht 67.0 in | Wt 284.0 lb

## 2022-02-14 DIAGNOSIS — Z9989 Dependence on other enabling machines and devices: Secondary | ICD-10-CM

## 2022-02-14 DIAGNOSIS — R7303 Prediabetes: Secondary | ICD-10-CM

## 2022-02-14 DIAGNOSIS — E559 Vitamin D deficiency, unspecified: Secondary | ICD-10-CM | POA: Diagnosis not present

## 2022-02-14 DIAGNOSIS — R232 Flushing: Secondary | ICD-10-CM

## 2022-02-14 DIAGNOSIS — G4733 Obstructive sleep apnea (adult) (pediatric): Secondary | ICD-10-CM

## 2022-02-14 DIAGNOSIS — Z6841 Body Mass Index (BMI) 40.0 and over, adult: Secondary | ICD-10-CM

## 2022-02-14 DIAGNOSIS — E669 Obesity, unspecified: Secondary | ICD-10-CM

## 2022-02-14 HISTORY — DX: Prediabetes: R73.03

## 2022-02-14 HISTORY — DX: Obstructive sleep apnea (adult) (pediatric): G47.33

## 2022-02-14 HISTORY — DX: Flushing: R23.2

## 2022-02-14 MED ORDER — ERGOCALCIFEROL 1.25 MG (50000 UT) PO CAPS: 50000.0000 [IU] | ORAL_CAPSULE | ORAL | 0 refills | Status: DC

## 2022-02-19 NOTE — Progress Notes (Signed)
Chief Complaint:   OBESITY Melinda Ward is here to discuss her progress with her obesity treatment plan along with follow-up of her obesity related diagnoses. Melinda Ward is on keeping a food journal and adhering to recommended goals of 1300-1500 calories and 100 protein and states she is following her eating plan approximately 20% of the time. Melinda Ward states she is not exercising.   Today's visit was #: 7 Starting weight: 284 lbs Starting date: 10/24/2021 Today's weight: 284 lbs Today's date: 02/14/2022 Total lbs lost to date: 0 Total lbs lost since last in-office visit: +2 lbs  Interim History: has cut out sweet tea.  Appetite has reduced.  Sugar cravings have reduced on Metformin, but having diarrhea.  Back to meal skipping.  No regular exercise.  Stress levels remain high.  Feels frustrated by no weight loss in past 4 months.   Subjective:   1. Prediabetes Metformin 500 mg BID, causing diarrhea everyday.  She declined use of GLP-1 agonist due to cost.    2. OSA on CPAP Wearing CPAP with 7-8 hours of sleep at night.   3. Vitamin D deficiency She is currently taking prescription vitamin D 50,000 IU each week. She denies nausea, vomiting or muscle weakness.  Energy level stable.    4. Hot flashes S/P TAH without BSO, unsure if premenopausal.  Assessment/Plan:   1. Prediabetes Discontinue metformin.  Continue to work on decreasing sugar intake and weight loss.   Check labs  - Hemoglobin A1c  2. OSA on CPAP Continue nightly CPAP.   3. Vitamin D deficiency Refill - ergocalciferol (VITAMIN D2) 1.25 MG (50000 UT) capsule; Take 1 capsule (50,000 Units total) by mouth once a week.  Dispense: 4 capsule; Refill: 0  Recheck today - Vitamin D, 25-Hydroxy, Total  4. Hot flashes Check labs today.   - FSH/LH  5. Obesity,current BMI 44.5 Discussed option of weight loss surgery given BMI >40 with co-morbidities.  Melinda Ward is currently in the action stage of change. As  such, her goal is to continue with weight loss efforts. She has agreed to keeping a food journal and adhering to recommended goals of 1500 calories and 90-100 protein daily.   Exercise goals:  increased walking to 30 minutes 4 times per week.   Behavioral modification strategies: increasing lean protein intake, increasing water intake, decreasing eating out, no skipping meals, meal planning and cooking strategies, better snacking choices, and decreasing junk food.  Melinda Ward has agreed to follow-up with our clinic in 3 weeks. She was informed of the importance of frequent follow-up visits to maximize her success with intensive lifestyle modifications for her multiple health conditions.   Melinda Ward was informed we would discuss her lab results at her next visit unless there is a critical issue that needs to be addressed sooner. Melinda Ward agreed to keep her next visit at the agreed upon time to discuss these results.  Objective:   Blood pressure 124/77, pulse 69, temperature 98.2 F (36.8 C), height 5\' 7"  (1.702 m), weight 284 lb (128.8 kg), last menstrual period 04/04/2018, SpO2 100 %. Body mass index is 44.48 kg/m.  General: Cooperative, alert, well developed, in no acute distress. HEENT: Conjunctivae and lids unremarkable. Cardiovascular: Regular rhythm.  Lungs: Normal work of breathing. Neurologic: No focal deficits.   Lab Results  Component Value Date   CREATININE 0.73 08/03/2021   BUN 11 08/03/2021   NA 139 08/03/2021   K 3.4 (L) 08/03/2021   CL 105 08/03/2021   CO2 25 08/03/2021  Lab Results  Component Value Date   ALT 11 08/03/2021   AST 14 (L) 08/03/2021   ALKPHOS 78 08/03/2021   BILITOT 0.4 08/03/2021   Lab Results  Component Value Date   HGBA1C 5.9 (H) 02/14/2022   HGBA1C 5.7 (H) 08/21/2021   Lab Results  Component Value Date   INSULIN 5.5 10/24/2021   Lab Results  Component Value Date   TSH 1.860 10/24/2021   Lab Results  Component Value Date   CHOL  193 10/24/2021   HDL 84 10/24/2021   LDLCALC 99 10/24/2021   TRIG 53 10/24/2021   Lab Results  Component Value Date   VD25OH 24.4 (L) 10/24/2021   Lab Results  Component Value Date   WBC 4.4 08/03/2021   HGB 12.2 08/03/2021   HCT 37.9 08/03/2021   MCV 87.1 08/03/2021   PLT 313 08/03/2021   No results found for: "IRON", "TIBC", "FERRITIN"  Attestation Statements:   Reviewed by clinician on day of visit: allergies, medications, problem list, medical history, surgical history, family history, social history, and previous encounter notes.  I, Malcolm Metro, am acting as Energy manager for [name], DO.  I have reviewed the above documentation for accuracy and completeness, and I agree with the above. Glennis Brink, DO

## 2022-02-20 LAB — FSH/LH
FSH: 47.2 m[IU]/mL
LH: 41.4 m[IU]/mL

## 2022-02-20 LAB — VITAMIN D, 25-HYDROXY, TOTAL: Vitamin D, 25-Hydroxy, Serum: 36 ng/mL

## 2022-02-20 LAB — HEMOGLOBIN A1C
Est. average glucose Bld gHb Est-mCnc: 123 mg/dL
Hgb A1c MFr Bld: 5.9 % — ABNORMAL HIGH (ref 4.8–5.6)

## 2022-02-26 ENCOUNTER — Ambulatory Visit (INDEPENDENT_AMBULATORY_CARE_PROVIDER_SITE_OTHER): Payer: No Typology Code available for payment source | Admitting: Orthopaedic Surgery

## 2022-02-26 ENCOUNTER — Telehealth: Payer: Self-pay

## 2022-02-26 ENCOUNTER — Ambulatory Visit (INDEPENDENT_AMBULATORY_CARE_PROVIDER_SITE_OTHER): Payer: No Typology Code available for payment source

## 2022-02-26 ENCOUNTER — Encounter: Payer: Self-pay | Admitting: Orthopaedic Surgery

## 2022-02-26 DIAGNOSIS — S62521A Displaced fracture of distal phalanx of right thumb, initial encounter for closed fracture: Secondary | ICD-10-CM

## 2022-02-26 NOTE — Telephone Encounter (Signed)
Patient seen by Melinda Ward today and he is requesting for patient to follow up with [facility]. 

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
## 2022-03-11 ENCOUNTER — Ambulatory Visit (INDEPENDENT_AMBULATORY_CARE_PROVIDER_SITE_OTHER): Payer: Managed Care, Other (non HMO) | Admitting: Podiatry

## 2022-03-11 DIAGNOSIS — L608 Other nail disorders: Secondary | ICD-10-CM | POA: Diagnosis not present

## 2022-03-11 NOTE — Progress Notes (Signed)
   Chief Complaint  Patient presents with   toe nail discoloration   Nail Problem    Patient states that the 4th digit right foot appears to have some fungus and the other toe nail have white spots.    HPI: 46 y.o. female presenting today for new complaint regarding discoloration to the right fourth toenail plate.  Patient is concerned for toenail fungus.  She says there is some slight sensitivity to the distal tip of the toe as well.  Patient is also noticed some white superficial areas of discoloration to the bilateral great toenails.  She denies a history of injury.  She has not done anything currently for treatment  Past Medical History:  Diagnosis Date   Acid reflux    Back pain    Carpal tunnel syndrome    Dysphasia    Headache    High cholesterol    Hypertension    Pre-diabetes    Sleep apnea    Swelling of lower extremity    Vitamin D deficiency     Past Surgical History:  Procedure Laterality Date   ABDOMINAL HYSTERECTOMY     ECTOPIC PREGNANCY SURGERY     FOOT SURGERY Right 05/2020   LYMPH NODE BIOPSY     TUBAL LIGATION      Allergies  Allergen Reactions   Latex Rash   Wound Dressing Adhesive Other (See Comments)    Burns skin      Physical Exam: General: The patient is alert and oriented x3 in no acute distress.  Dermatology: The toenail to the right fourth digit is not hyperkeratotic but there is some melanin pigmentation throughout the entire nail plate.  Normal variant.  No clinical concern for fungal nail infection There is also superficial bite focal areas along the bilateral great toenail plates consistent with history of pedicures and nail polish  Neurovascular status intact  Musculoskeletal Exam: No pedal deformities noted  Assessment: 1.  Hyperpigmented nail plate right fourth digit 2.  Superficial keratin granulation secondary to nail polish and remover   Plan of Care:  1. Patient evaluated.  2.  Discussed nail polish remover and effects  causing the superficial keratin granulations.  Discontinue nail polish remover to allow the granulations to grow out with time 3.  Silicone toe sleeve was dispensed to the patient to apply to the right fourth digit 4.  Recommend good supportive shoes that are wide in the toebox area and allow plenty of room for the toes 5.  Return to clinic as needed     Edrick Kins, DPM [facility]  Dr. Edrick Kins, DPM    [address], [place] [address]                Office [phone]  Fax [phone]

## 2022-03-18 ENCOUNTER — Ambulatory Visit (INDEPENDENT_AMBULATORY_CARE_PROVIDER_SITE_OTHER): Payer: Managed Care, Other (non HMO) | Admitting: Family Medicine

## 2022-03-18 ENCOUNTER — Encounter (INDEPENDENT_AMBULATORY_CARE_PROVIDER_SITE_OTHER): Payer: Self-pay | Admitting: Family Medicine

## 2022-03-18 VITALS — BP 138/85 | HR 63 | Temp 98.4°F | Ht 67.0 in | Wt 282.0 lb

## 2022-03-18 DIAGNOSIS — K219 Gastro-esophageal reflux disease without esophagitis: Secondary | ICD-10-CM | POA: Diagnosis not present

## 2022-03-18 DIAGNOSIS — R7303 Prediabetes: Secondary | ICD-10-CM

## 2022-03-18 DIAGNOSIS — R232 Flushing: Secondary | ICD-10-CM | POA: Diagnosis not present

## 2022-03-18 DIAGNOSIS — E559 Vitamin D deficiency, unspecified: Secondary | ICD-10-CM

## 2022-03-18 DIAGNOSIS — E669 Obesity, unspecified: Secondary | ICD-10-CM

## 2022-03-18 DIAGNOSIS — Z6841 Body Mass Index (BMI) 40.0 and over, adult: Secondary | ICD-10-CM

## 2022-03-18 MED ORDER — ERGOCALCIFEROL 1.25 MG (50000 UT) PO CAPS: 50000.0000 [IU] | ORAL_CAPSULE | ORAL | 0 refills | Status: DC

## 2022-03-19 DIAGNOSIS — K219 Gastro-esophageal reflux disease without esophagitis: Secondary | ICD-10-CM

## 2022-03-19 DIAGNOSIS — R7303 Prediabetes: Secondary | ICD-10-CM | POA: Insufficient documentation

## 2022-03-19 HISTORY — DX: Gastro-esophageal reflux disease without esophagitis: K21.9

## 2022-03-21 ENCOUNTER — Telehealth: Payer: Self-pay | Admitting: Orthopaedic Surgery

## 2022-03-21 NOTE — Telephone Encounter (Signed)
Patient aware CD is ready for pickup at front desk.  

## 2022-03-21 NOTE — Telephone Encounter (Signed)
Received call from patient. She needs xrys on CD to take to Dr. Amedeo Plenty. Please call when ready [phone]

## 2022-03-25 NOTE — Progress Notes (Unsigned)
Chief Complaint:   OBESITY Melinda Ward is here to discuss her progress with her obesity treatment plan along with follow-up of her obesity related diagnoses. Melinda Ward is on keeping a food journal and adhering to recommended goals of 1500-1900 calories and 90 protein and states she is following her eating plan approximately 0% of the time. Melinda Ward states she is not exercising.   Today's visit was #: 7 Starting weight: 284 lbs Starting date: 10/24/2021 Today's weight: 282 lbs Today's date: 03/18/2022 Total lbs lost to date: 2 lbs Total lbs lost since last in-office visit: 2 lbs  Interim History: She has started on a surgical pathway for bariatric surgery at [facility].  She is excited to proceed.  Has a friend that is her support person.   She has used dietary log, but has failed to see weight loss.   Subjective:   1. Hot flashes Discussed labs with patient today. FSH/LH confirms post menopausal.  S/P total abdominal hysterectomy 3 years ago. Bothered by night sweat and hot flashes.   2. Pre-diabetes Discussed labs with patient today. A1c 5.9 up from 5.7.  She is on metformin 500 mg daily. She is on track for bariatric surgery.   3. Vitamin D deficiency Vitamin D level 36 on prescription Vitamin D 50,000 IU weekly.   4. Gastroesophageal reflux disease, unspecified whether esophagitis present She is on omeprazole 40 mg daily.  Planning on EGD prior to bariatric surgery.  Assessment/Plan:   1. Hot flashes She has an office visit with OB/GYN to discuss hormone replacement therapy.   2. Pre-diabetes Discontinue - metFORMIN (GLUCOPHAGE) 500 MG tablet; Take by mouth.  Read labels for added sugar.   3. Vitamin D deficiency Refill - ergocalciferol (VITAMIN D2) 1.25 MG (50000 UT) capsule; Take 1 capsule (50,000 Units total) by mouth once a week.  Dispense: 12 capsule; Refill: 0  4. Gastroesophageal reflux disease, unspecified whether esophagitis present Look for  improvements with sleeve gastric bypass.   5. Obesity,current BMI 44.3 Proceed with plan for bariatric surgery. Try out protein shakes and bars with <8 grams of sugar.   Melinda Ward is currently in the action stage of change. As such, her goal is to continue with weight loss efforts. She has agreed to keeping a food journal and adhering to recommended goals of 1500 calories and 90 protein daily.   Exercise goals:  check out local gyms.   Behavioral modification strategies: increasing lean protein intake, increasing vegetables, increasing water intake, decreasing eating out, no skipping meals, meal planning and cooking strategies, better snacking choices, and decreasing junk food.  Melinda Ward has agreed to follow-up with our clinic in 12 weeks. She was informed of the importance of frequent follow-up visits to maximize her success with intensive lifestyle modifications for her multiple health conditions.   Objective:   Blood pressure 138/85, pulse 63, temperature 98.4 F (36.9 C), height 5\' 7"  (1.702 m), weight 282 lb (127.9 kg), last menstrual period 04/04/2018, SpO2 100 %. Body mass index is 44.17 kg/m.  General: Cooperative, alert, well developed, in no acute distress. HEENT: Conjunctivae and lids unremarkable. Cardiovascular: Regular rhythm.  Lungs: Normal work of breathing. Neurologic: No focal deficits.   Lab Results  Component Value Date   CREATININE 0.73 08/03/2021   BUN 11 08/03/2021   NA 139 08/03/2021   K 3.4 (L) 08/03/2021   CL 105 08/03/2021   CO2 25 08/03/2021   Lab Results  Component Value Date   ALT 11 08/03/2021  AST 14 (L) 08/03/2021   ALKPHOS 78 08/03/2021   BILITOT 0.4 08/03/2021   Lab Results  Component Value Date   HGBA1C 5.9 (H) 02/14/2022   HGBA1C 5.7 (H) 08/21/2021   Lab Results  Component Value Date   INSULIN 5.5 10/24/2021   Lab Results  Component Value Date   TSH 1.860 10/24/2021   Lab Results  Component Value Date   CHOL 193  10/24/2021   HDL 84 10/24/2021   LDLCALC 99 10/24/2021   TRIG 53 10/24/2021   Lab Results  Component Value Date   VD25OH 24.4 (L) 10/24/2021   Lab Results  Component Value Date   WBC 4.4 08/03/2021   HGB 12.2 08/03/2021   HCT 37.9 08/03/2021   MCV 87.1 08/03/2021   PLT 313 08/03/2021   No results found for: "IRON", "TIBC", "FERRITIN"   Attestation Statements:   Reviewed by clinician on day of visit: allergies, medications, problem list, medical history, surgical history, family history, social history, and previous encounter notes.  I, Davy Pique, am acting as Location manager for Loyal Gambler, DO.  I have reviewed the above documentation for accuracy and completeness, and I agree with the above. Dell Ponto, DO

## 2022-05-01 HISTORY — PX: LAPAROSCOPIC GASTRIC SLEEVE RESECTION: SHX5895

## 2022-06-18 ENCOUNTER — Telehealth (INDEPENDENT_AMBULATORY_CARE_PROVIDER_SITE_OTHER): Payer: Managed Care, Other (non HMO) | Admitting: Family Medicine

## 2022-06-18 NOTE — Progress Notes (Deleted)
TeleHealth Visit:  This visit was completed with telemedicine (audio/video) technology. Melinda Ward has verbally consented to this TeleHealth visit. The patient is located at home, the provider is located at home. The participants in this visit include the listed provider and patient. The visit was conducted today via MyChart video.  OBESITY Melinda Ward is here to discuss her progress with her obesity treatment plan along with follow-up of her obesity related diagnoses.   Today's visit was # 8 Starting weight: 284 lbs Starting date: 10/24/2021 Weight at last in office visit: 282 lbs on 03/18/22 Total weight loss: 2 lbs at last in office visit on 03/18/22. Today's reported weight: *** lbs No weight reported.  Nutrition Plan: keeping a food journal and adhering to recommended goals of 1500-1600 calories and 90 grams protein.   Current exercise: {exercise types:16438} none  Interim History:  ***  Assessment/Plan:  1. ***  2. ***  3. ***  Obesity: Current BMI *** Melinda Ward {CHL AMB IS/IS NOT:210130109} currently in the action stage of change. As such, her goal is to {MWMwtloss#1:210800005}.  She has agreed to {MWMwtlossportion/plan2:23431}.   Exercise goals: {MWM EXERCISE RECS:23473}  Behavioral modification strategies: {MWMwtlossdietstrategies3:23432}.  Melinda Ward has agreed to follow-up with our clinic in {NUMBER 1-10:22536} weeks.   No orders of the defined types were placed in this encounter.   There are no discontinued medications.   No orders of the defined types were placed in this encounter.     Objective:   VITALS: Per patient if applicable, see vitals. GENERAL: Alert and in no acute distress. CARDIOPULMONARY: No increased WOB. Speaking in clear sentences.  PSYCH: Pleasant and cooperative. Speech normal rate and rhythm. Affect is appropriate. Insight and judgement are appropriate. Attention is focused, linear, and appropriate.  NEURO: Oriented as  arrived to appointment on time with no prompting.   Lab Results  Component Value Date   CREATININE 0.73 08/03/2021   BUN 11 08/03/2021   NA 139 08/03/2021   K 3.4 (L) 08/03/2021   CL 105 08/03/2021   CO2 25 08/03/2021   Lab Results  Component Value Date   ALT 11 08/03/2021   AST 14 (L) 08/03/2021   ALKPHOS 78 08/03/2021   BILITOT 0.4 08/03/2021   Lab Results  Component Value Date   HGBA1C 5.9 (H) 02/14/2022   HGBA1C 5.7 (H) 08/21/2021   Lab Results  Component Value Date   INSULIN 5.5 10/24/2021   Lab Results  Component Value Date   TSH 1.860 10/24/2021   Lab Results  Component Value Date   CHOL 193 10/24/2021   HDL 84 10/24/2021   LDLCALC 99 10/24/2021   TRIG 53 10/24/2021   Lab Results  Component Value Date   WBC 4.4 08/03/2021   HGB 12.2 08/03/2021   HCT 37.9 08/03/2021   MCV 87.1 08/03/2021   PLT 313 08/03/2021   No results found for: "IRON", "TIBC", "FERRITIN" Lab Results  Component Value Date   VD25OH 24.4 (L) 10/24/2021    Attestation Statements:   Reviewed by clinician on day of visit: allergies, medications, problem list, medical history, surgical history, family history, social history, and previous encounter notes.  ***(delete if time-based billing not used) Time spent on visit including the items listed below was *** minutes.  -preparing to see the patient (e.g., review of tests, history, previous notes) -obtaining and/or reviewing separately obtained history -counseling and educating the patient/family/caregiver -documenting clinical information in the electronic or other health record -ordering medications, tests, or procedures -independently interpreting results and  communicating results to the patient/ family/caregiver -referring and communicating with other health care professionals  -care coordination

## 2022-06-24 NOTE — Progress Notes (Signed)
[facility] [address] [place]. [place] [address] [phone]       OFFICE FOLLOW UP NOTE  [name] Date of Birth:  03-Jan-1976 Medical Record Number:  [id number]   Reason for visit: CPAP follow-up    SUBJECTIVE:   CHIEF COMPLAINT:  Chief Complaint  Patient presents with   Follow-up    Patien tin room #1 and alone. Pt here today to f/u with her CPAP. Patient has been sick lately and hasn't been using her CPAP.    HPI:   Melinda Ward is a 47 year old right-handed woman with an underlying medical history of paresthesias, recurrent headaches, hypertension, and severe obesity with a BMI of over 40, who Presents for follow-up consultation of her obstructive sleep apnea currently being treated on CPAP.  Was first seen by Dr. Frances Furbish 09/11/2021 at the request of Dr. Marjory Lies on 09/11/2021, at which time she reported snoring and excessive daytime somnolence as well as morning headaches.  She was advised to proceed with sleep testing.  She had a home sleep test on 10/01/2021 which showed an AHI of 12.1/h, O2 nadir 86%.  He was advised to proceed with AutoPap therapy.  Her set up date was 10/19/2021.  She has a [employer] air sense 11 AutoSet machine.   Update 06/25/2022 JM: Patient returns for 14-month CPAP compliance visit.  Reports more recently has been dealing with illness over the past several weeks, initially thought she may have COVID or the flu but tested negative for both, now dealing with sinus infection. Unable to tolerate mask currently due to congestion. Prior to getting sick, she was doing good with CPAP, will have leaks at times which can wake her up, otherwise feels she gets the best sleep of her life when using, feels good during the day. She is eager to start feeling better to get restarted with CPAP.  She did undergo bariatric surgery 04/2022 and has since lost 30 pounds.  She has since been able to come off of her blood pressure medication and  metformin.  Epworth Sleepiness Scale 1/24.           History provided for reference purposes only Update 12/20/2021 Dr. Frances Furbish: I reviewed her AutoPap compliance data from 11/19/2021 through 12/18/2021, which is a total of 30 days, during which time she used her machine 27 days with percent use days greater than 4 hours at 67%, indicating slightly suboptimal compliance, average usage of 5 hours and 56 minutes, residual AHI at goal at 0.6/h, average pressure for the 95th percentile at 9.2 cm with a range of 5 to 11 cm with EPR of 1, leak on the higher side with the 95th percentile at 20.8 L/min.  In the past 60 days her compliance altogether is a little bit less, percent use days greater than 4 hours at 62%, average usage of 5 hours and 15 minutes.  She reports doing a little better after initial issues with her mask.  The first mask was a nasal mask which was not tolerated.  She had a runny nose, she woke up sneezing, she then started using a N30i nasal pillows interface from [employer] but the size medium was irritating her nostrils and caused a sore under her nose.  She switched to the small and this is much better tolerated.  However, the headgear slides off often, more so recently, she has been in touch with her to provide her for a new headgear, she has changed the insert.  She has also  changed the filter.  She is not quite sure if she has benefited from treatment as yet, she certainly does not feel any worse.  She is motivated to continue with treatment.  Her Epworth sleepiness score is 7/24, previously was 11.  She is currently out of work since 11/24/2021 due to a right thumb injury.  She admits that she has been able to go to bed a little earlier because of not having to keep her work schedule for now.   Consult visit 09/11/21 Dr. Rexene Alberts: (She) reports snoring and excessive daytime somnolence as well as morning headaches.  I reviewed your office note from 08/21/2021.  She had previous sleep testing  through Dr. Chales Salmon.  I was able to review her home sleep test report from study dated 06/09/2019.  Overall AHI was 1.8/h, lowest desaturation 78%.  Time below 85% saturation was 1.4 minutes.  Her Epworth sleepiness score is 11 out of 24, fatigue severity score is 37 out of 63.  She works for [employer] as a Administrator.  Her first sleep study was recommended because she was a Administrator.  She reports that she did not sleep well at home at the time.  She has been noted by her partner to have pauses or irregularities in her breathing to where they check on her sometimes.  She is single and lives alone, no children.  She has 1 dog in the household.  She has caffeine in the form of tea and soda, soda about 3 bottles and occasional tea, she is working on caffeine reduction.  She drinks alcohol very occasionally, typically on special occasion, she is a non-smoker.  She has woken up with a sense of choking.  She works from 11:15 AM to 9:30 PM or 9:45 PM.  She is generally home by 10 or 10:30 PM.  She is typically in bed by 1130 but may not be asleep till later even as late as 1 AM or so.  Wake time is 8 AM.  She is typically awake before then, around 6:30 AM.  She does not have night to night nocturia but does have recurrent morning headaches.  She is working on weight loss.  She is currently no longer on phentermine, this was through her previous primary care.         ROS:   14 system review of systems performed and negative with exception of those listed in HPI  PMH:  Past Medical History:  Diagnosis Date   Acid reflux    Back pain    Carpal tunnel syndrome    Dysphasia    Headache    High cholesterol    Hypertension    Pre-diabetes    Sleep apnea    Swelling of lower extremity    Vitamin D deficiency     PSH:  Past Surgical History:  Procedure Laterality Date   ABDOMINAL HYSTERECTOMY     ECTOPIC PREGNANCY SURGERY     FOOT SURGERY Right 05/2020   LYMPH NODE BIOPSY     TUBAL LIGATION       Social History:  Social History   Socioeconomic History   Marital status: Single    Spouse name: Not on file   Number of children: 0   Years of education: Not on file   Highest education level: Associate degree: academic program  Occupational History    Comment: [employer]   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  Tobacco Use   Smoking status: Never   Smokeless tobacco:  Never  Vaping Use   Vaping Use: Never used  Substance and Sexual Activity   Alcohol use: Not Currently    Comment: occ   Drug use: Never   Sexual activity: Yes  Other Topics Concern   Not on file  Social History Narrative   Caffeine- sodas 2-3 a day, tea   Social Determinants of Health   Financial Resource Strain: Not on file  Food Insecurity: Not on file  Transportation Needs: Not on file  Physical Activity: Not on file  Stress: Not on file  Social Connections: Not on file  Intimate Partner Violence: Not on file    Family History:  Family History  Problem Relation Age of Onset   Cancer Mother        cervical   High blood pressure Mother    Thyroid disease Mother    Sleep apnea Father    Sudden death Father    Cancer Father    Sleep apnea Maternal Uncle     Medications:   Current Outpatient Medications on File Prior to Visit  Medication Sig Dispense Refill   valACYclovir (VALTREX) 1000 MG tablet SMARTSIG:1 Tablet(s) By Mouth Every 12 Hours PRN     No current facility-administered medications on file prior to visit.    Allergies:   Allergies  Allergen Reactions   Latex Rash   Wound Dressing Adhesive Other (See Comments)    Burns skin       OBJECTIVE:  Physical Exam  Vitals:   06/25/22 0733  BP: 130/84  Pulse: 64  Weight: 248 lb 6.4 oz (112.7 kg)  Height: 5\' 6"  (1.676 m)   Body mass index is 40.09 kg/m. No results found.   General: well developed, well nourished, very pleasant middle-aged African-American female, seated, in no evident distress Head: head normocephalic and atraumatic.    Neck: supple with no carotid or supraclavicular bruits Cardiovascular: regular rate and rhythm, no murmurs Musculoskeletal: no deformity Skin:  no rash/petichiae Vascular:  Normal pulses all extremities   Neurologic Exam Mental Status: Awake and fully alert. Oriented to place and time. Recent and remote memory intact. Attention span, concentration and fund of knowledge appropriate. Mood and affect appropriate.  Cranial Nerves: Pupils equal, briskly reactive to light. Extraocular movements full without nystagmus. Visual fields full to confrontation. Hearing intact. Facial sensation intact. Face, tongue, palate moves normally and symmetrically.  Motor: Normal bulk and tone. Normal strength in all tested extremity muscles Sensory.: intact to touch , pinprick , position and vibratory sensation.  Coordination: Rapid alternating movements normal in all extremities. Finger-to-nose and heel-to-shin performed accurately bilaterally. Gait and Station: Arises from chair without difficulty. Stance is normal. Gait demonstrates normal stride length and balance without use of AD.  Reflexes: 1+ and symmetric. Toes downgoing.         ASSESSMENT/PLAN: Melinda Ward is a 46 y.o. year old female    OSA on CPAP : Compliance report shows suboptimal usage although optimal residual AHI with use.  Reports poor compliance over the past several weeks due to URI and now sinus infection which makes tolerating CPAP difficult.  Eager to start feeling better to restart use. Will send order to DME company for mask refitting due to leaks.  She was commended on her weight loss since bariatric surgery back in November. She would be interested in repeating sleep study to see if apnea is still present, discussed waiting until further weight loss. Discussed importance of nightly usage with ensuring greater than 4 hours nightly  for optimal benefit and per insurance purposes.  Continue to follow with DME company for any  needed supplies or CPAP related concerns     Follow up in 6 months or call earlier if needed   CC:  PCP: Donald Prose, MD    I spent 21 minutes of face-to-face and non-face-to-face time with patient.  This included previsit chart review, lab review, study review, order entry, electronic health record documentation, patient education regarding sleep apnea with review and discussion of compliance report and answered all other questions to patient's satisfaction   Frann Rider, [facility]  [facility] [address] [place], [place] [address]  Phone [phone] Fax [phone] Note: This document was prepared with digital dictation and possible smart phrase technology. Any transcriptional errors that result from this process are unintentional.

## 2022-06-25 ENCOUNTER — Ambulatory Visit (INDEPENDENT_AMBULATORY_CARE_PROVIDER_SITE_OTHER): Payer: 59 | Admitting: Adult Health

## 2022-06-25 ENCOUNTER — Telehealth: Payer: Self-pay

## 2022-06-25 ENCOUNTER — Encounter: Payer: Self-pay | Admitting: Adult Health

## 2022-06-25 VITALS — BP 130/84 | HR 64 | Ht 66.0 in | Wt 248.4 lb

## 2022-06-25 DIAGNOSIS — G4733 Obstructive sleep apnea (adult) (pediatric): Secondary | ICD-10-CM | POA: Diagnosis not present

## 2022-11-22 ENCOUNTER — Other Ambulatory Visit: Payer: Self-pay

## 2022-11-22 ENCOUNTER — Encounter (HOSPITAL_BASED_OUTPATIENT_CLINIC_OR_DEPARTMENT_OTHER): Payer: Self-pay | Admitting: Emergency Medicine

## 2022-11-22 DIAGNOSIS — Z9884 Bariatric surgery status: Secondary | ICD-10-CM | POA: Diagnosis not present

## 2022-11-22 DIAGNOSIS — R1110 Vomiting, unspecified: Secondary | ICD-10-CM | POA: Diagnosis not present

## 2022-11-22 DIAGNOSIS — R1013 Epigastric pain: Secondary | ICD-10-CM | POA: Insufficient documentation

## 2022-11-22 DIAGNOSIS — Z5321 Procedure and treatment not carried out due to patient leaving prior to being seen by health care provider: Secondary | ICD-10-CM | POA: Insufficient documentation

## 2022-11-22 LAB — CBC
HCT: 37.8 % (ref 36.0–46.0)
Hemoglobin: 12.4 g/dL (ref 12.0–15.0)
MCH: 29.2 pg (ref 26.0–34.0)
MCHC: 32.8 g/dL (ref 30.0–36.0)
MCV: 89.2 fL (ref 80.0–100.0)
Platelets: 345 10*3/uL (ref 150–400)
RBC: 4.24 MIL/uL (ref 3.87–5.11)
RDW: 13.6 % (ref 11.5–15.5)
WBC: 4.5 10*3/uL (ref 4.0–10.5)
nRBC: 0 % (ref 0.0–0.2)

## 2022-11-22 LAB — COMPREHENSIVE METABOLIC PANEL
ALT: 11 U/L (ref 0–44)
AST: 14 U/L — ABNORMAL LOW (ref 15–41)
Albumin: 4.5 g/dL (ref 3.5–5.0)
Alkaline Phosphatase: 105 U/L (ref 38–126)
Anion gap: 9 (ref 5–15)
BUN: 13 mg/dL (ref 6–20)
CO2: 28 mmol/L (ref 22–32)
Calcium: 10 mg/dL (ref 8.9–10.3)
Chloride: 104 mmol/L (ref 98–111)
Creatinine, Ser: 0.77 mg/dL (ref 0.44–1.00)
GFR, Estimated: >60 mL/min (ref >60)
Glucose, Bld: 92 mg/dL (ref 70–99)
Potassium: 3.9 mmol/L (ref 3.5–5.1)
Sodium: 141 mmol/L (ref 135–145)
Total Bilirubin: 0.4 mg/dL (ref 0.3–1.2)
Total Protein: 7.7 g/dL (ref 6.5–8.1)

## 2022-11-22 LAB — LIPASE, BLOOD: Lipase: 29 U/L (ref 11–51)

## 2022-11-22 NOTE — ED Triage Notes (Addendum)
Pt presents to ED POV. Pt c/o epigastric pain and reports that it radiates to her back. Pt also c/o emesis x2. Pt reports that she had bariatric surgery in 05/01/22.

## 2022-11-23 ENCOUNTER — Emergency Department (HOSPITAL_BASED_OUTPATIENT_CLINIC_OR_DEPARTMENT_OTHER)
Admission: EM | Admit: 2022-11-23 | Discharge: 2022-11-23 | Discharge status: Against Medical Advice | Payer: 59 | Attending: Emergency Medicine | Admitting: Emergency Medicine

## 2022-11-23 NOTE — ED Notes (Addendum)
Pt called x3. Not visualized in lobby or bathroom.   Registration notes she left at 2230

## 2022-11-24 ENCOUNTER — Emergency Department (HOSPITAL_BASED_OUTPATIENT_CLINIC_OR_DEPARTMENT_OTHER)
Admission: EM | Admit: 2022-11-24 | Discharge: 2022-11-24 | Disposition: A | Discharge status: Home / Self Care | Payer: 59 | Attending: Emergency Medicine | Admitting: Emergency Medicine

## 2022-11-24 ENCOUNTER — Encounter (HOSPITAL_BASED_OUTPATIENT_CLINIC_OR_DEPARTMENT_OTHER): Payer: Self-pay | Admitting: Emergency Medicine

## 2022-11-24 ENCOUNTER — Emergency Department (HOSPITAL_BASED_OUTPATIENT_CLINIC_OR_DEPARTMENT_OTHER): Payer: 59

## 2022-11-24 DIAGNOSIS — R1013 Epigastric pain: Secondary | ICD-10-CM | POA: Diagnosis present

## 2022-11-24 DIAGNOSIS — Z91040 Latex allergy status: Secondary | ICD-10-CM | POA: Insufficient documentation

## 2022-11-24 DIAGNOSIS — K2900 Acute gastritis without bleeding: Secondary | ICD-10-CM | POA: Diagnosis not present

## 2022-11-24 LAB — COMPREHENSIVE METABOLIC PANEL
ALT: 9 U/L (ref 0–44)
AST: 14 U/L — ABNORMAL LOW (ref 15–41)
Albumin: 3.7 g/dL (ref 3.5–5.0)
Alkaline Phosphatase: 85 U/L (ref 38–126)
Anion gap: 7 (ref 5–15)
BUN: 10 mg/dL (ref 6–20)
CO2: 28 mmol/L (ref 22–32)
Calcium: 9.3 mg/dL (ref 8.9–10.3)
Chloride: 107 mmol/L (ref 98–111)
Creatinine, Ser: 0.66 mg/dL (ref 0.44–1.00)
GFR, Estimated: >60 mL/min (ref >60)
Glucose, Bld: 89 mg/dL (ref 70–99)
Potassium: 4 mmol/L (ref 3.5–5.1)
Sodium: 142 mmol/L (ref 135–145)
Total Bilirubin: 0.4 mg/dL (ref 0.3–1.2)
Total Protein: 6.6 g/dL (ref 6.5–8.1)

## 2022-11-24 LAB — URINALYSIS, W/ REFLEX TO CULTURE (INFECTION SUSPECTED)
Bacteria, UA: NONE SEEN
Bilirubin Urine: NEGATIVE
Glucose, UA: NEGATIVE mg/dL
Hgb urine dipstick: NEGATIVE
Ketones, ur: NEGATIVE mg/dL
Leukocytes,Ua: NEGATIVE
Nitrite: NEGATIVE
Specific Gravity, Urine: 1.029 (ref 1.005–1.030)
pH: 6.5 (ref 5.0–8.0)

## 2022-11-24 LAB — LIPASE, BLOOD: Lipase: 30 U/L (ref 11–51)

## 2022-11-24 LAB — CBC WITH DIFFERENTIAL/PLATELET
Abs Immature Granulocytes: 0.01 10*3/uL (ref 0.00–0.07)
Basophils Absolute: 0 10*3/uL (ref 0.0–0.1)
Basophils Relative: 0 %
Eosinophils Absolute: 0.1 10*3/uL (ref 0.0–0.5)
Eosinophils Relative: 4 %
HCT: 33.7 % — ABNORMAL LOW (ref 36.0–46.0)
Hemoglobin: 11.3 g/dL — ABNORMAL LOW (ref 12.0–15.0)
Immature Granulocytes: 0 %
Lymphocytes Relative: 45 %
Lymphs Abs: 1.6 10*3/uL (ref 0.7–4.0)
MCH: 29.7 pg (ref 26.0–34.0)
MCHC: 33.5 g/dL (ref 30.0–36.0)
MCV: 88.7 fL (ref 80.0–100.0)
Monocytes Absolute: 0.3 10*3/uL (ref 0.1–1.0)
Monocytes Relative: 9 %
Neutro Abs: 1.5 10*3/uL — ABNORMAL LOW (ref 1.7–7.7)
Neutrophils Relative %: 42 %
Platelets: 301 10*3/uL (ref 150–400)
RBC: 3.8 MIL/uL — ABNORMAL LOW (ref 3.87–5.11)
RDW: 13.4 % (ref 11.5–15.5)
WBC: 3.6 10*3/uL — ABNORMAL LOW (ref 4.0–10.5)
nRBC: 0 % (ref 0.0–0.2)

## 2022-11-24 LAB — HEPATITIS PANEL, ACUTE
HCV Ab: NONREACTIVE
Hep A IgM: NONREACTIVE
Hep B C IgM: NONREACTIVE
Hepatitis B Surface Ag: NONREACTIVE

## 2022-11-24 MED ORDER — SODIUM CHLORIDE 0.9 % IV BOLUS
1000.0000 mL | Freq: Once | INTRAVENOUS | Status: AC
  Administered 2022-11-24: 1000 mL via INTRAVENOUS

## 2022-11-24 MED ORDER — OMEPRAZOLE 40 MG PO CPDR: 40.0000 mg | DELAYED_RELEASE_CAPSULE | Freq: Two times a day (BID) | ORAL | 0 refills | Status: DC

## 2022-11-24 MED ORDER — FENTANYL CITRATE PF 50 MCG/ML IJ SOSY
100.0000 ug | PREFILLED_SYRINGE | Freq: Once | INTRAMUSCULAR | Status: AC
  Administered 2022-11-24: 100 ug via INTRAVENOUS
  Filled 2022-11-24: qty 2

## 2022-11-24 MED ORDER — FAMOTIDINE 20 MG PO TABS: 20.0000 mg | ORAL_TABLET | Freq: Two times a day (BID) | ORAL | 0 refills | Status: AC

## 2022-11-24 MED ORDER — ONDANSETRON HCL 4 MG/2ML IJ SOLN
4.0000 mg | Freq: Once | INTRAMUSCULAR | Status: DC
  Filled 2022-11-24 (×2): qty 2

## 2022-11-24 MED ORDER — SUCRALFATE 1 GM/10ML PO SUSP: 1.0000 g | Freq: Three times a day (TID) | ORAL | 0 refills | Status: DC

## 2022-11-24 MED ORDER — IOHEXOL 300 MG/ML  SOLN
100.0000 mL | Freq: Once | INTRAMUSCULAR | Status: AC | PRN
  Administered 2022-11-24: 100 mL via INTRAVENOUS

## 2022-11-24 MED ORDER — PANTOPRAZOLE SODIUM 40 MG IV SOLR
40.0000 mg | Freq: Once | INTRAVENOUS | Status: AC
  Administered 2022-11-24: 40 mg via INTRAVENOUS
  Filled 2022-11-24: qty 10

## 2022-11-24 NOTE — ED Provider Triage Note (Signed)
Emergency Medicine Provider Triage Evaluation Note  Davine Sweney , a 47 y.o. female  was evaluated in triage.  Pt complains of abdominal pain.  Review of Systems  Positive: Upper abdominal pain Negative: Chest pain  Physical Exam  BP 110/82   Pulse (!) 44   Temp 98 F (36.7 C)   Resp 20   LMP 04/04/2018 (Exact Date)   SpO2 100%  Gen:   Awake, no distress   Resp:  Normal effort  MSK:   Moves extremities without difficulty  Other:  Epigastric abd pain  Medical Decision Making  Medically screening exam initiated at 6:17 AM.  Appropriate orders placed.  Mindi Akerson was informed that the remainder of the evaluation will be completed by another provider, this initial triage assessment does not replace that evaluation, and the importance of remaining in the ED until their evaluation is complete.  Pt already had labs drawn on 6/21 - will defer for now   Zadie Rhine, MD 11/24/22 [phone]

## 2022-11-24 NOTE — ED Provider Notes (Addendum)
[facility] AT [facility] Provider Note   CSN: [id number] Arrival date & time: 11/24/22  0007     History  Chief Complaint  Patient presents with   Abdominal Pain    Melinda Ward is a 47 y.o. female.  HPI 47 year old female presents with epigastric abdominal pain.  Started after eating 2 days ago.  Came here originally but then left due to the wait.  Pain has been constant and worsens with eating.  Feels like something is twisting in her epigastrium.  She is also having pain in her back.  No chest pain or shortness of breath.  Has vomited a couple times but no blood.  No black or bloody stool.  She had a gastric sleeve in November which has contributed significant weight loss and resolution of previous medical problems such as hypertension and diabetes.  She was given some fentanyl prior to me seeing her and states her pain is a lot better.  Home Medications Prior to Admission medications   Medication Sig Start Date End Date Taking? Authorizing Provider  famotidine (PEPCID) 20 MG tablet Take 1 tablet (20 mg total) by mouth 2 (two) times daily. 11/24/22  Yes Pricilla Loveless, MD  omeprazole (PRILOSEC) 40 MG capsule Take 1 capsule (40 mg total) by mouth 2 (two) times daily before a meal for 7 days. 11/24/22 12/01/22 Yes Pricilla Loveless, MD  sucralfate (CARAFATE) 1 GM/10ML suspension Take 10 mLs (1 g total) by mouth 4 (four) times daily -  with meals and at bedtime. 11/24/22  Yes Pricilla Loveless, MD  valACYclovir (VALTREX) 1000 MG tablet SMARTSIG:1 Tablet(s) By Mouth Every 12 Hours PRN 05/11/20   [provider]      Allergies    Latex and Wound dressing adhesive    Review of Systems   Review of Systems  Constitutional:  Negative for fever.  Respiratory:  Negative for shortness of breath.   Cardiovascular:  Negative for chest pain.  Gastrointestinal:  Positive for abdominal pain and vomiting. Negative for blood in stool and diarrhea.   Musculoskeletal:  Positive for back pain.    Physical Exam Updated Vital Signs BP (!) 134/100   Pulse (!) 49   Temp 98 F (36.7 C)   Resp 20   LMP 04/04/2018 (Exact Date)   SpO2 98%  Physical Exam Vitals and nursing note reviewed.  Constitutional:      General: She is not in acute distress.    Appearance: She is well-developed. She is not ill-appearing or diaphoretic.  HENT:     Head: Normocephalic and atraumatic.  Cardiovascular:     Rate and Rhythm: Normal rate and regular rhythm.     Heart sounds: Normal heart sounds.  Pulmonary:     Effort: Pulmonary effort is normal.     Breath sounds: Normal breath sounds.  Abdominal:     Palpations: Abdomen is soft.     Tenderness: There is abdominal tenderness in the epigastric area, left upper quadrant and left lower quadrant.  Skin:    General: Skin is warm and dry.  Neurological:     Mental Status: She is alert.     ED Results / Procedures / Treatments   Labs (all labs ordered are listed, but only abnormal results are displayed) Labs Reviewed  COMPREHENSIVE METABOLIC PANEL - Abnormal; Notable for the following components:      Result Value   AST 14 (*)    All other components within normal limits  CBC WITH DIFFERENTIAL/PLATELET -  Abnormal; Notable for the following components:   WBC 3.6 (*)    RBC 3.80 (*)    Hemoglobin 11.3 (*)    HCT 33.7 (*)    Neutro Abs 1.5 (*)    All other components within normal limits  URINALYSIS, W/ REFLEX TO CULTURE (INFECTION SUSPECTED) - Abnormal; Notable for the following components:   Protein, ur TRACE (*)    All other components within normal limits  LIPASE, BLOOD  HEPATITIS PANEL, ACUTE    EKG EKG Interpretation  Date/Time:  Sunday November 24 2022 07:48:47 EDT Ventricular Rate:  41 PR Interval:  171 QRS Duration: 97 QT Interval:  502 QTC Calculation: 415 R Axis:   67 Text Interpretation: Sinus bradycardia no acute ST/T changes similar to 2 days ago Confirmed by Pricilla Loveless [id number]) on 11/24/2022 7:51:45 AM  Radiology CT ABDOMEN PELVIS W CONTRAST  Result Date: 11/24/2022 CLINICAL DATA:  Abdominal pain.  Status post sleeve gastrectomy. EXAM: CT ABDOMEN AND PELVIS WITH CONTRAST TECHNIQUE: Multidetector CT imaging of the abdomen and pelvis was performed using the standard protocol following bolus administration of intravenous contrast. RADIATION DOSE REDUCTION: This exam was performed according to the departmental dose-optimization program which includes automated exposure control, adjustment of the mA and/or kV according to patient size and/or use of iterative reconstruction technique. CONTRAST:  OMNIPAQUE IOHEXOL 300 MG/ML  SOLN COMPARISON:  None Available. FINDINGS: Lower chest: No acute abnormality. Hepatobiliary: Mild periportal edema with mild heterogeneous attenuation of the liver parenchyma. Relative hypertrophy of the caudate lobe and lateral segment of left hepatic lobe is noted. Trace perihepatic ascites is noted along the inferior margin of the right lobe of liver, image 42/2. No focal liver abnormality is seen. No gallstones, gallbladder wall thickening, or biliary dilatation. Pancreas: No pancreatic mass or inflammation. Mild increase caliber of the main pancreatic duct measures up to 4 mm. Spleen: Normal in size without focal abnormality. Adrenals/Urinary Tract: Adrenal glands are unremarkable. Kidneys are normal, without renal calculi, focal lesion, or hydronephrosis. Bladder is unremarkable. Stomach/Bowel: Postop change involving the stomach compatible with previous bariatric surgery. Stomach appears nondistended. No pathologic dilatation of the large or small bowel loops. The appendix is visualized and is within normal limits. Vascular/Lymphatic: Normal appearance of the upper abdominal vascularity with patent portal vein. Abdominal aorta has a normal caliber. No signs of abdominopelvic adenopathy. Reproductive: Uterus appears surgically absent.  No  adnexal mass. Other: Free fluid noted within the dependent portion of the pelvis, image 70/2. No discrete fluid collections identified. No signs of pneumoperitoneum. Musculoskeletal: No acute or significant osseous findings. IMPRESSION: 1. No signs of bowel obstruction or abscess. 2. Mild periportal edema with mild heterogeneous attenuation of the liver parenchyma. Trace perihepatic ascites is noted along the inferior margin of the right lobe of liver. Correlate for any clinical signs or symptoms of hepatitis. 3. Relative hypertrophy of the caudate lobe and lateral segment of left hepatic lobe is noted. These findings may be seen in the setting of early cirrhosis. 4. Mild increase caliber of the main pancreatic duct measures up to 4 mm. No pancreatic mass or inflammation identified. Electronically Signed   By: Signa Kell M.D.   On: 11/24/2022 09:19    Procedures Procedures    Medications Ordered in ED Medications  ondansetron [facility]) injection 4 mg (4 mg Intravenous Not Given 11/24/22 0645)  fentaNYL (SUBLIMAZE) injection 100 mcg (100 mcg Intravenous Given 11/24/22 0637)  sodium chloride 0.9 % bolus 1,000 mL (0 mLs Intravenous Stopped 11/24/22  1610)  pantoprazole (PROTONIX) injection 40 mg (40 mg Intravenous Given 11/24/22 0736)  iohexol (OMNIPAQUE) 300 MG/ML solution 100 mL (100 mLs Intravenous Contrast Given 11/24/22 9604)    ED Course/ Medical Decision Making/ A&P                             Medical Decision Making Amount and/or Complexity of Data Reviewed Labs: ordered.    Details: Mildly lower hemoglobin at 11.3 compared to a couple days ago. Radiology: ordered and independent interpretation performed.    Details: No obvious free air or perforation/SBO. ECG/medicine tests: ordered and independent interpretation performed.    Details: Sinus bradycardia  Risk Prescription drug management.   I suspect patient is having gastritis.  She is feeling better after the fentanyl given but  this is lasted for several hours and I do not think further emergent treatment is needed.  She did also get a dose of Protonix.  She is on omeprazole 20 mg twice daily and I think increasing this for now is reasonable.  She did have a slight drop in her hemoglobin over the last couple days and I discussed we could evaluate further for occult GI bleeding and discussed rectal exam but she has declined this.  At this point, I think it would be reasonable to try and treat with higher dose PPI, other gastritis type medications, and have her follow-up closely with gastroenterology as well as her bariatric surgeon.  She has noted to have some nonspecific liver findings though she does not have any significant alcohol abuse history and her LFTs are normal here.  Will send hepatitis panel and I made the patient aware of this and I think she needs to follow-up with gastroenterology.  Otherwise, she knows to avoid NSAIDs and feels well enough for discharge home with return precautions at this point.   Of note, she is bradycardic but this doesn't appear to be symptomatic or pathologic based on presentation and sinus rhythm on EKG.     Final Clinical Impression(s) / ED Diagnoses Final diagnoses:  Acute gastritis, presence of bleeding unspecified, unspecified gastritis type    Rx / DC Orders ED Discharge Orders          Ordered    omeprazole (PRILOSEC) 40 MG capsule  2 times daily before meals        11/24/22 0940    sucralfate (CARAFATE) 1 GM/10ML suspension  3 times daily with meals & bedtime        11/24/22 0940    famotidine (PEPCID) 20 MG tablet  2 times daily        11/24/22 0940              Pricilla Loveless, MD 11/24/22 1120    Pricilla Loveless, MD 11/24/22 1121

## 2022-11-24 NOTE — ED Triage Notes (Signed)
Abdo pain started Friday 3:30 PM LWBS Friday evening Pain continues getting worse. Epigastric pain  "Twisting"  Refused blood work in triage

## 2022-11-24 NOTE — Discharge Instructions (Addendum)
For the next week, increase your omeprazole/Prilosec to 40 mg twice per day.  I have provided you a prescription in case you need extra tablets.  You are also being given some other medications to help with stomach irritation/inflammation such as Pepcid and Carafate.  There was some nonspecific abnormality around your liver, we have sent some extra liver testing but you will need to follow-up with a gastroenterologist.  If you develop new or worsening abdominal pain, noticed bloody or black stools, have vomiting, or any other new/concerning symptoms then return to the ER or call 911.

## 2022-12-23 NOTE — Progress Notes (Unsigned)
error 

## 2022-12-24 ENCOUNTER — Telehealth (INDEPENDENT_AMBULATORY_CARE_PROVIDER_SITE_OTHER): Payer: Self-pay | Admitting: Adult Health

## 2022-12-24 ENCOUNTER — Encounter: Payer: Self-pay | Admitting: Adult Health

## 2022-12-24 ENCOUNTER — Telehealth: Payer: 59 | Admitting: Adult Health

## 2022-12-24 DIAGNOSIS — G4733 Obstructive sleep apnea (adult) (pediatric): Secondary | ICD-10-CM

## 2022-12-24 DIAGNOSIS — Z789 Other specified health status: Secondary | ICD-10-CM

## 2022-12-24 NOTE — Progress Notes (Signed)
[facility] [address] [place]. [place] [address] [phone]       OFFICE FOLLOW UP NOTE  Ms. Kalleigh Harbor Date of Birth:  02-12-1976 Medical Record Number:  [id number]   Reason for visit: CPAP follow-up  Virtual Visit via Video Note  I connected with Philis Kendall on 46/23/24 at  3:45 PM EDT by a video enabled telemedicine application and verified that I am speaking with the correct person using two identifiers.  Location: Patient: at work Provider: in office   I discussed the limitations of evaluation and management by telemedicine and the availability of in person appointments. The patient expressed understanding and agreed to proceed.   SUBJECTIVE:   Follow-up visit:  Prior visit: 06/25/2022   Brief HPI:   Ms. Bleau is a 47 year old right-handed woman with an underlying medical history of paresthesias, recurrent headaches, hypertension, and severe obesity with a BMI of over 40, who Presents for follow-up consultation of her obstructive sleep apnea currently being treated on CPAP.  Was first seen by Dr. Frances Furbish 09/11/2021 at the request of Dr. Marjory Lies on 09/11/2021, at which time she reported snoring and excessive daytime somnolence as well as morning headaches.  Completed HST on 10/01/2021 which showed an AHI of 12.1/h, O2 nadir 86%.  Autopap set up date 10/19/2021.  She has a ResMed air sense 11 AutoSet machine.    Interval history:  She has not been using her CPAP routinely over at least the past 90 days.  Did use 1 night about 3 weeks ago but has great difficulty tolerating current nasal pillow mask as it causes nose irritation. Review of download below also shows elevated leak rate with use. She has noticed increased fatigue, insomnia and snoring since she has stopped regular use. ESS 15/24 (prior 1/24).  She has lost 100 pounds since initial sleep study, is hopeful to lose additional weight with BMI goal 28-29, currently at  31.        ROS:   14 system review of systems performed and negative with exception of those listed in HPI  PMH:  Past Medical History:  Diagnosis Date   Acid reflux    Back pain    Carpal tunnel syndrome    Dysphasia    Headache    High cholesterol    Hypertension    Pre-diabetes    Sleep apnea    Swelling of lower extremity    Vitamin D deficiency     PSH:  Past Surgical History:  Procedure Laterality Date   ABDOMINAL HYSTERECTOMY     ECTOPIC PREGNANCY SURGERY     FOOT SURGERY Right 05/2020   LYMPH NODE BIOPSY     TUBAL LIGATION      Social History:  Social History   Socioeconomic History   Marital status: Single    Spouse name: Not on file   Number of children: 0   Years of education: Not on file   Highest education level: Associate degree: academic program  Occupational History    Comment: [employer]   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  Tobacco Use   Smoking status: Never   Smokeless tobacco: Never  Vaping Use   Vaping status: Never Used  Substance and Sexual Activity   Alcohol use: Not Currently    Comment: occ   Drug use: Never   Sexual activity: Yes  Other Topics Concern   Not on file  Social History Narrative   Caffeine- sodas 2-3 a day, tea   Social Determinants of Health  Financial Resource Strain: Medium Risk (06/07/2022)   Received from [facility], [facility]   Overall Financial Resource Strain (CARDIA)    Difficulty of Paying Living Expenses: Somewhat hard  Food Insecurity: No Food Insecurity (06/07/2022)   Received from [facility]   Hunger Vital Sign    Worried About Running Out of Food in the Last Year: Never true    Ran Out of Food in the Last Year: Never true  Recent Concern: Food Insecurity - Food Insecurity Present (03/12/2022)   Received from [facility]   Hunger Vital Sign    Worried About Running Out of Food in the Last Year: Sometimes true    Ran Out of Food in the Last Year: Sometimes true  Transportation  Needs: No Transportation Needs (06/07/2022)   Received from [employer], [facility]   PRAPARE - Transportation    Lack of Transportation (Medical): No    Lack of Transportation (Non-Medical): No  Physical Activity: Inactive (04/15/2022)   Received from [facility], [facility]   Exercise Vital Sign    Days of Exercise per Week: 0 days    Minutes of Exercise per Session: 0 min  Stress: No Stress Concern Present (04/15/2022)   Received from [facility], [facility] of Occupational Health - Occupational Stress Questionnaire    Feeling of Stress : Not at all  Social Connections: Socially Integrated (04/15/2022)   Received from [facility], [facility]   Social Network    How would you rate your social network (family, work, friends)?: Good participation with social networks  Intimate Partner Violence: Not At Risk (04/15/2022)   Received from [facility], [facility]   HITS    Over the last 12 months how often did your partner physically hurt you?: 1    Over the last 12 months how often did your partner insult you or talk down to you?: 1    Over the last 12 months how often did your partner threaten you with physical harm?: 1    Over the last 12 months how often did your partner scream or curse at you?: 1    Family History:  Family History  Problem Relation Age of Onset   Cancer Mother        cervical   High blood pressure Mother    Thyroid disease Mother    Sleep apnea Father    Sudden death Father    Cancer Father    Sleep apnea Maternal Uncle     Medications:   Current Outpatient Medications on File Prior to Visit  Medication Sig Dispense Refill   famotidine (PEPCID) 20 MG tablet Take 1 tablet (20 mg total) by mouth 2 (two) times daily. 30 tablet 0   omeprazole (PRILOSEC) 40 MG capsule Take 1 capsule (40 mg total) by mouth 2 (two) times daily before a meal for 7 days. 14 capsule 0   sucralfate (CARAFATE) 1 GM/10ML suspension Take 10  mLs (1 g total) by mouth 4 (four) times daily -  with meals and at bedtime. 420 mL 0   valACYclovir (VALTREX) 1000 MG tablet SMARTSIG:1 Tablet(s) By Mouth Every 12 Hours PRN     No current facility-administered medications on file prior to visit.    Allergies:   Allergies  Allergen Reactions   Latex Rash   Wound Dressing Adhesive Other (See Comments)    Burns skin       OBJECTIVE: N/A d/t visit type  ASSESSMENT/PLAN: Chanteria Haggard is a 47 y.o. year old female    OSA on CPAP : has not been using recently due to pain caused from interface. Will place order to DME company requesting evaluation for more comfortable interface to improve compliance.  Discussed importance of restarting especially with return of symptoms without use. Did have 100 lb weight loss since initial study but due to continued symptoms, suspect apnea still present. Advised if symptoms improve with further weight loss and she would like to repeat sleep study to see if apnea still present to let me know. Continue to follow with DME company for any needed supplies or CPAP related concerns.     Follow up in 6 months or call earlier if needed   CC:  PCP: Deatra James, MD    I spent 30 minutes of face-to-face and non-face-to-face time with patient via MyChart VV.  This included previsit chart review, lab review, study review, order entry, electronic health record documentation, patient education regarding sleep apnea with review and discussion of compliance report and answered all other questions to patient's satisfaction   Ihor Austin, [facility]  [facility] [address] [place], [place] [address]  Phone [phone] Fax [phone] Note: This document was prepared with digital dictation and possible smart phrase technology. Any transcriptional errors that result from this process are unintentional.

## 2023-01-02 IMAGING — CT CT ANGIO HEAD-NECK (W OR W/O PERF)
1 of 11 series · 6 of 35 positions shown · IV contrast (APPLIED)
Comparison: None.

CLINICAL DATA: AVM/AVF, high-flow vascular malformation.

EXAM:
CT ANGIOGRAPHY HEAD AND NECK
TECHNIQUE: Multidetector CT imaging of the head and neck was performed using
the standard protocol during bolus administration of intravenous
contrast. Multiplanar CT image reconstructions and MIPs were
obtained to evaluate the vascular anatomy. Carotid stenosis
measurements (when applicable) are obtained utilizing NASCET
criteria, using the distal internal carotid diameter as the
denominator.

[Series 12: ax thin · axial · 0.55mm/px · z∈[-305,-81]mm · 6 of 332 slices shown]
[im 48/332  soft-tissue]
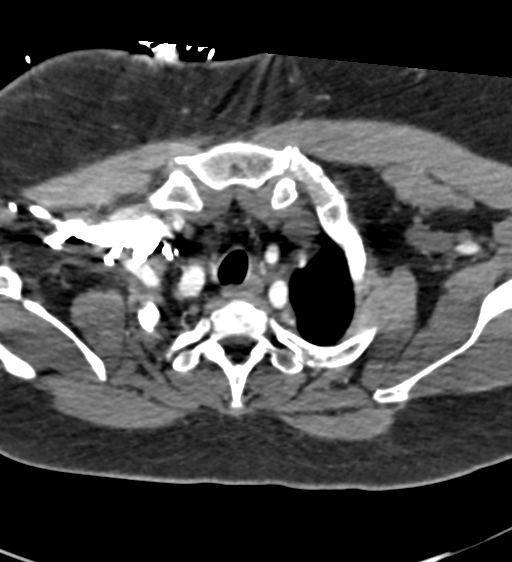
[im 95/332  bone]
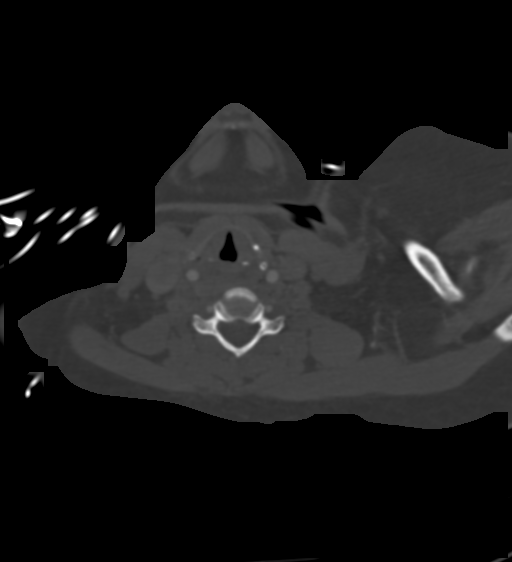
[im 142/332  soft-tissue]
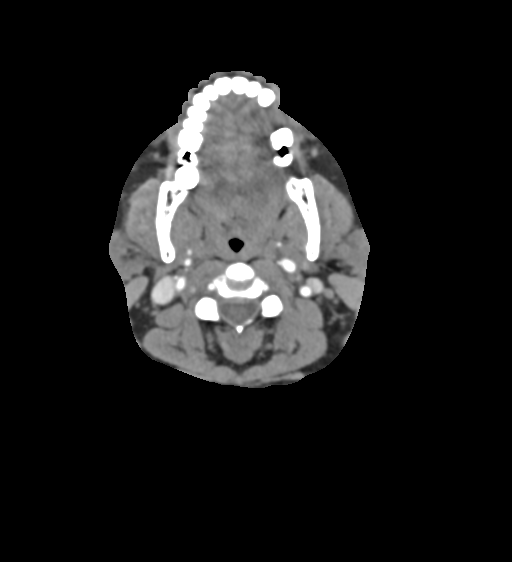
[im 190/332  bone]
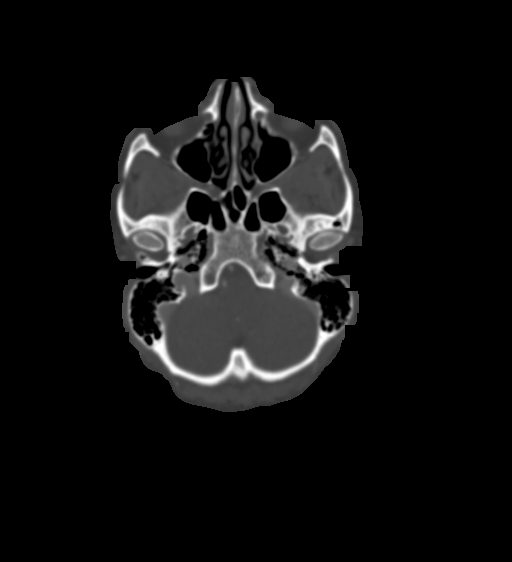
[im 237/332  soft-tissue]
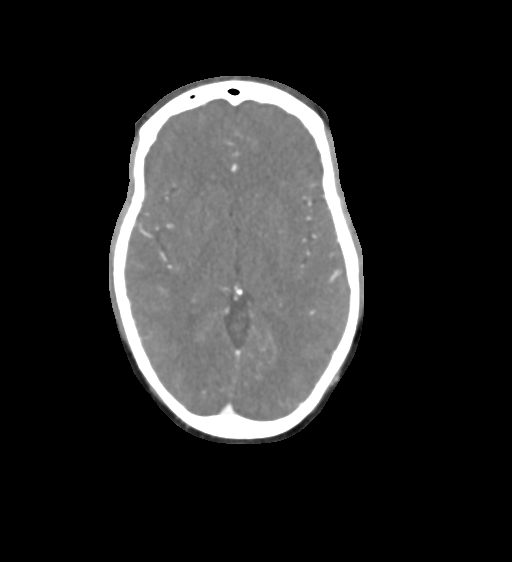
[im 284/332  bone]
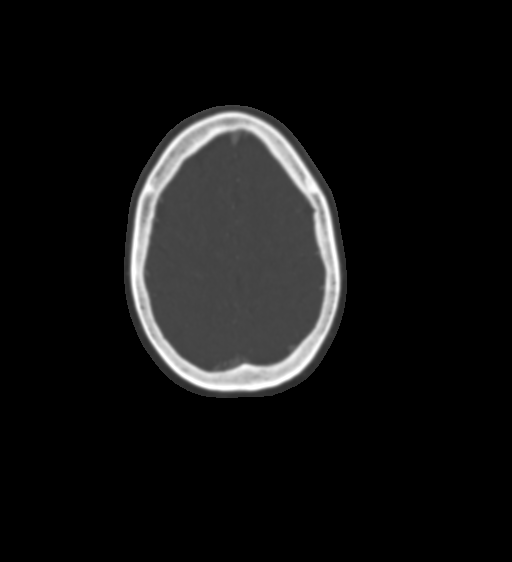

[6 of 35 positions shown; findings below may reference images not displayed]

RADIATION DOSE REDUCTION: This exam was performed according to the
departmental dose-optimization program which includes automated
exposure control, adjustment of the mA and/or kV according to
patient size and/or use of iterative reconstruction technique.

CONTRAST:  100mL OMNIPAQUE IOHEXOL 350 MG/ML SOLN
FINDINGS: CT HEAD FINDINGS

Brain: No evidence of acute infarction, hemorrhage, hydrocephalus,
extra-axial collection or mass lesion/mass effect.

Vascular: No hyperdense vessel or unexpected calcification.

Skull: Normal. Negative for fracture or focal lesion.

Sinuses: Imaged portions are clear.

Orbits: No acute finding.

Review of the MIP images confirms the above findings

CTA NECK FINDINGS

Aortic arch: Standard branching. Imaged portion shows no evidence of
aneurysm or dissection. No significant stenosis of the major arch
vessel origins.

Right carotid system: Mild luminal irregularity of the mid and upper
cervical segment of the right ICA suggesting vessel disease. No
evidence of dissection, stenosis (50% or greater) or occlusion.

Left carotid system: Mild luminal irregularity of the mid and upper
cervical segment of the left ICA suggesting vessel disease. No
evidence of dissection, stenosis (50% or greater) or occlusion.

Vertebral arteries: Codominant. No evidence of dissection, stenosis
(50% or greater) or occlusion. Mild luminal irregularity in the
distal V2-V3 segment of the bilateral vertebral arteries suggesting
vessel wall disease.

Skeleton: No acute or aggressive lesion identified.

Other neck: Negative.

Upper chest: Negative.

Review of the MIP images confirms the above findings

CTA HEAD FINDINGS

Anterior circulation: No significant stenosis, proximal occlusion,
aneurysm, or vascular malformation.

Posterior circulation: No significant stenosis, proximal occlusion,
aneurysm, or vascular malformation.

Venous sinuses: As permitted by contrast timing, patent.

Anatomic variants: None significant.

Review of the MIP images confirms the above findings
IMPRESSION: 1. No acute intracranial abnormality.
2. No aneurysm, AVM or dural AV fistula.
3. No high-grade stenosis of the major neck or intracranial
arteries.
4. Mild luminal irregularity of the cervical internal carotid
arteries and vertebral arteries, as described above, concerning for
possible vessel disease such as fibromuscular dysplasia.

## 2023-04-10 ENCOUNTER — Ambulatory Visit: Payer: Managed Care, Other (non HMO) | Attending: Cardiology | Admitting: Cardiology

## 2023-06-09 ENCOUNTER — Encounter: Payer: Self-pay | Admitting: Adult Health

## 2023-06-09 ENCOUNTER — Telehealth: Payer: 59 | Admitting: Adult Health

## 2023-06-09 VITALS — Ht 66.0 in | Wt 182.0 lb

## 2023-06-09 DIAGNOSIS — Z789 Other specified health status: Secondary | ICD-10-CM | POA: Diagnosis not present

## 2023-06-09 DIAGNOSIS — G4733 Obstructive sleep apnea (adult) (pediatric): Secondary | ICD-10-CM | POA: Diagnosis not present

## 2023-06-09 NOTE — Progress Notes (Signed)
 [facility] [address] [place]. [place] [address] [phone]       OFFICE FOLLOW UP NOTE  [name] Date of Birth:  Mar 09, 1976 Medical Record Number:  [id number]   Reason for visit: CPAP follow-up  Virtual Visit via Video Note  I connected with Melinda Ward on 06/09/23 at  3:45 PM EST by a video enabled telemedicine application and verified that I am speaking with the correct person using two identifiers.  Location: Patient: at work Provider: in office   I discussed the limitations of evaluation and management by telemedicine and the availability of in person appointments. The patient expressed understanding and agreed to proceed.   SUBJECTIVE:   Follow-up visit:  Prior visit: 12/24/2022   Brief HPI:   Melinda Ward is a 48 year old right-handed woman with an underlying medical history of paresthesias, recurrent headaches, hypertension, and severe obesity with a BMI of over 40, who Presents for follow-up consultation of her obstructive sleep apnea currently being treated on CPAP.  Was first seen by Dr. Buck 09/11/2021 at the request of Dr. Margaret on 09/11/2021, at which time she reported snoring and excessive daytime somnolence as well as morning headaches.  Completed HST on 10/01/2021 which showed an AHI of 12.1/h, O2 nadir 86%.  Autopap set up date 10/19/2021.  She has a ResMed air sense 11 AutoSet machine.  At prior visit, have been noncompliant with CPAP due to difficulty tolerating nasal pillow mask causing nose irritation. Noted increased fatigue, insomnia and snoring since stopping. Noted 100lb weight loss since initial sleep study.    Interval history:  She has not been using her machine since prior visit. She reports just recently receiving new supplies including her new mask which is a nasal craddle. She worse for 1 night but then went out of town and forgot to bring CPAP with her. She did tolerate her new mask well  that night and slept well throughout the night. 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 She is currently down to 182lbs, which is 104 lb weight loss since initial sleep study.  She is scheduled end of May for a skin removal consult. She would be interested in repeating a sleep study to see if sleep apnea is still present.        ROS:   14 system review of systems performed and negative with exception of those listed in HPI  PMH:  Past Medical History:  Diagnosis Date   Acid reflux    Back pain    Carpal tunnel syndrome    Dysphasia    Headache    High cholesterol    Hypertension    Pre-diabetes    Sleep apnea    Swelling of lower extremity    Vitamin D  deficiency     PSH:  Past Surgical History:  Procedure Laterality Date   ABDOMINAL HYSTERECTOMY     ECTOPIC PREGNANCY SURGERY     FOOT SURGERY Right 05/2020   LYMPH NODE BIOPSY     TUBAL LIGATION      Social History:  Social History   Socioeconomic History   Marital status: Single    Spouse name: Not on file   Number of children: 0   Years of education: Not on file   Highest education level: Associate degree: academic program  Occupational History    Comment: [employer]   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  Tobacco Use   Smoking status: Never   Smokeless tobacco: Never  Vaping Use   Vaping status: Never Used  Substance and Sexual Activity   Alcohol use: Not Currently    Comment: occ   Drug use: Never   Sexual activity: Yes  Other Topics Concern   Not on file  Social History Narrative   Caffeine- sodas 2-3 a day, tea   Social Drivers of Health   Financial Resource Strain: Medium Risk (06/07/2022)   Received from [facility], [facility]   Overall Financial Resource Strain (CARDIA)    Difficulty of Paying Living Expenses: Somewhat hard  Food Insecurity: No Food Insecurity (06/07/2022)   Received from [facility]   Hunger Vital Sign    Worried About Running Out of Food in the Last Year: Never true    Ran Out of Food in the Last Year: Never true  Recent Concern: Food Insecurity - Food Insecurity Present (03/12/2022)   Received from [facility] [place]   Hunger Vital Sign    Worried About Running Out of Food in the Last Year: Sometimes true    Ran Out of Food in the Last Year: Sometimes true  Transportation Needs: No Transportation Needs (06/07/2022)   Received from [employer] [facility], [facility]   PRAPARE - Transportation    Lack of Transportation (Medical): No    Lack of Transportation (Non-Medical): No  Physical Activity: Inactive (04/15/2022)   Received from [facility], [facility]   Exercise Vital Sign    Days of Exercise per Week: 0 days    Minutes of Exercise per Session: 0 min  Stress: No Stress Concern Present (04/15/2022)   Received from [facility], [facility] - Occupational Stress Questionnaire    Feeling of Stress : Not at all  Social Connections: Socially Integrated (04/15/2022)   Received from [facility], [facility]   Social Network    How would you rate your social network (family, work, friends)?: Good participation with social networks  Intimate Partner Violence: Not At Risk (04/15/2022)   Received from [facility], [facility]   HITS    Over the last 12 months how often did your partner physically hurt you?: Never    Over the last 12 months how often did your partner insult you or talk down to you?: Never    Over the last 12 months how often did your partner threaten you with physical harm?: Never    Over the last 12 months how often did your partner scream or curse at you?: Never    Family History:  Family History  Problem Relation Age of Onset   Cancer Mother        cervical   High blood pressure Mother    Thyroid disease Mother    Sleep apnea Father    Sudden death Father    Cancer Father    Sleep  apnea Maternal Uncle     Medications:   Current Outpatient Medications on File Prior to Visit  Medication Sig Dispense Refill   famotidine  (PEPCID ) 20 MG tablet Take 1 tablet (20 mg total) by mouth 2 (two) times daily. 30 tablet 0   omeprazole  (PRILOSEC) 40 MG capsule Take 1 capsule (40 mg total) by mouth 2 (two) times daily before a meal for 7 days. 14 capsule 0   sucralfate  (CARAFATE ) 1 GM/10ML suspension Take 10 mLs (1 g total) by mouth 4 (four) times daily -  with meals and at bedtime. 420 mL 0   valACYclovir (VALTREX) 1000 MG tablet SMARTSIG:1 Tablet(s) By Mouth Every 12 Hours PRN     No current facility-administered medications on file prior to visit.    Allergies:   Allergies  Allergen Reactions   Latex Rash   Wound Dressing Adhesive Other (See Comments)    Burns skin       OBJECTIVE: N/A d/t visit type       ASSESSMENT/PLAN: Melinda Ward is a 48 y.o. year old female    Mild OSA: priro intolerance to nasal pillow mask, recently received nasal craddle but has only used for 1 night. She has lost 104 lbs since initial sleep study, will place order for repeat HST to see if apnea is still present and if CPAP is still needed. If apnea still present, she is agreeable to retry CPAP with new nasal craddle mask or we could discuss alternative options such as oral appliance if apnea remains mild.     Follow-up will be determined after HST completed    CC:  PCP: Sun, Vyvyan, MD    I spent 20 minutes of face-to-face and non-face-to-face time with patient via MyChart VV.  This included previsit chart review, lab review, study review, order entry, electronic health record documentation, patient education regarding sleep apnea with review and discussion of compliance report and answered all other questions to patient's satisfaction  Harlene Bogaert, [facility]  [facility] [address] [place], [place] [address]  Phone [phone]  Fax [phone] Note: This document was prepared with digital dictation and possible smart phrase technology. Any transcriptional errors that result from this process are unintentional.

## 2023-06-13 ENCOUNTER — Ambulatory Visit: Payer: 59 | Admitting: Neurology

## 2023-06-13 DIAGNOSIS — R0683 Snoring: Secondary | ICD-10-CM

## 2023-06-13 DIAGNOSIS — Z789 Other specified health status: Secondary | ICD-10-CM

## 2023-06-13 DIAGNOSIS — G4733 Obstructive sleep apnea (adult) (pediatric): Secondary | ICD-10-CM | POA: Diagnosis not present

## 2023-06-19 ENCOUNTER — Ambulatory Visit: Payer: 59 | Attending: Internal Medicine | Admitting: Internal Medicine

## 2023-06-19 NOTE — Progress Notes (Deleted)
  Cardiology Office Note:  .    Date:  06/19/2023  ID:  Melinda Ward, DOB [birth year], MRN [id number] PCP: Deatra James, MD  [facility] Providers Cardiologist:  None { Click to update primary MD,subspecialty MD or APP then REFRESH:1}    CC: *** Consulted for the evaluation of *** at the behest of ***   History of Present Illness: .    Melinda Ward is a 48 y.o. female ***  @Discussed  the use of AI scribe software for clinical note transcription with the patient, who gave verbal consent to proceed.  History of Present Illness             Relevant histories: .  Social *** ROS: As per HPI.   Studies Reviewed: .       Results          *** Risk Assessment/Calculations:    {Does this patient have ATRIAL FIBRILLATION?:[id number]}  {This patient may be at risk for Amyloid. She has one or more dx on the problem list or PMH from the following list - Abnormal EKG, CHF, Aortic Stenosis, Proteinuria, LVH, Carpal Tunnel Syndrome, Biceps Tendon Rupture, Syncope. See list below or review PMH.    Click HERE to open Cardiac Amyloid Screening SmartSet to order screening OR Click HERE to defer testing for 1 year or permanently :1}    Physical Exam:    VS:  LMP 04/04/2018 (Exact Date)    Wt Readings from Last 3 Encounters:  06/09/23 182 lb (82.6 kg)  06/25/22 248 lb 6.4 oz (112.7 kg)  03/18/22 282 lb (127.9 kg)    Gen: *** distress, *** obese/well nourished/malnourished   Neck: No JVD, *** carotid bruit Ears: *** Frank Sign Cardiac: No Rubs or Gallops, *** Murmur, ***cardia, *** radial pulses Respiratory: Clear to auscultation bilaterally, *** effort, ***  respiratory rate GI: Soft, nontender, non-distended *** MS: No *** edema; *** moves all extremities Integument: Skin feels *** Neuro:  At time of evaluation, alert and oriented to person/place/time/situation *** Psych: Normal affect, patient feels ***   ASSESSMENT AND PLAN: .     ***   Riley Lam, MD FASE [facility] Cardiologist [facility]  [address] [place], [place] [address] [phone]  10:00 AM

## 2023-06-20 ENCOUNTER — Encounter: Payer: Self-pay | Admitting: Internal Medicine

## 2023-06-24 NOTE — Progress Notes (Signed)
 See procedure note.

## 2023-06-25 NOTE — Procedures (Signed)
   [facility]  HOME SLEEP TEST (SANSA) REPORT (Mail-Out Device):   STUDY DATE: 06/17/2023  DOB: 08-19-1975  MRN: [id number]  ORDERING CLINICIAN: Huston Foley, MD, PhD   REFERRING CLINICIAN: Ihor Austin, NP  CLINICAL INFORMATION/HISTORY: 48 year old right-handed woman with an underlying medical history of paresthesias, recurrent headaches, hypertension, and severe obesity with a BMI of over 40, who presents for re-evaluation of her obstructive sleep apnea, s/p bariatric surgery. She has achieved an over 100 lb weight loss.    BMI (at the time of sleep clinic visit and/or test date): 29.7 kg/m  FINDINGS:   Study Protocol:    The SANSA single-point-of-skin-contact chest-worn sensor - an FDA and DOT approved type 4 home sleep test device - measures eight physiological channels,  including blood oxygen saturation (measured via PPG [photoplethysmography]), EKG-derived heart rate, respiratory effort, chest movement (measured via accelerometer), snoring, body position, and actigraphy. The device is designed to be worn for up to 10 hours per study.   Sleep Summary:   Total Recording Time (hours, min): 8 hours, 52 min  Total Effective Sleep Time (hours, min):  4 hours, 38 min  Sleep Efficiency (%):    74%   Respiratory Indices:   Calculated sAHI (per hour):  2.4/hour         Oxygen Saturation Statistics:    Oxygen Saturation (%) Mean: 97.9%   Minimum oxygen saturation (%):                 83.3%   O2 Saturation Range (%): 83.3- 100%    Pulse Rate Statistics:   Pulse Mean (bpm):    65/min    Pulse Range (51 -104/min)   Snoring: Intermittent, mild  IMPRESSION/DIAGNOSES:   Primary snoring   RECOMMENDATIONS:   This home sleep test does not demonstrate any significant obstructive or central sleep disordered breathing with a total AHI of less than 5/hour.  Her total AHI was 2.4/h, O2 nadir briefly at 83.3% without significant time below or at 88%  saturation for the night.  Snoring was intermittent, in the mild range.  Compared to 2023, her sleep apnea has significantly improved, likely due to interim weight loss.  Treatment with a positive airway pressure device such as AutoPap or CPAP is not indicated based on this test.   Other causes of the patient's symptoms, including circadian rhythm disturbances, an underlying mood disorder, medication effect and/or an underlying medical problem cannot be ruled out based on this test. Clinical correlation is recommended.  The patient should be cautioned not to drive, work at heights, or operate dangerous or heavy equipment when tired or sleepy. Review and reiteration of good sleep hygiene measures should be pursued with any patient. The patient will be advised to follow up with her referring provider, who will be notified of the test results.   I certify that I have reviewed the raw data recording prior to the issuance of this report in accordance with the standards of the American Academy of Sleep Medicine (AASM).    INTERPRETING PHYSICIAN:   Huston Foley, MD, PhD Medical Director, [facility]) Diplomat, ABPN (Neurology and Sleep)   [facility] [address], [address] [place], [place] [address] [phone]

## 2023-06-30 ENCOUNTER — Encounter: Payer: Self-pay | Admitting: Adult Health

## 2023-07-03 ENCOUNTER — Telehealth: Payer: 59 | Admitting: Adult Health

## 2023-08-19 ENCOUNTER — Ambulatory Visit: Payer: 59 | Attending: Cardiology | Admitting: Cardiology

## 2023-08-19 DIAGNOSIS — R001 Bradycardia, unspecified: Secondary | ICD-10-CM

## 2023-08-19 DIAGNOSIS — Z91199 Patient's noncompliance with other medical treatment and regimen due to unspecified reason: Secondary | ICD-10-CM

## 2023-08-19 HISTORY — DX: Bradycardia, unspecified: R00.1

## 2023-08-19 NOTE — Progress Notes (Unsigned)
 No show

## 2023-09-08 ENCOUNTER — Other Ambulatory Visit: Payer: Self-pay

## 2023-09-08 ENCOUNTER — Encounter (HOSPITAL_BASED_OUTPATIENT_CLINIC_OR_DEPARTMENT_OTHER): Payer: Self-pay

## 2023-09-08 ENCOUNTER — Emergency Department (HOSPITAL_BASED_OUTPATIENT_CLINIC_OR_DEPARTMENT_OTHER)
Admission: EM | Admit: 2023-09-08 | Discharge: 2023-09-09 | Disposition: A | Discharge status: Home / Self Care | Attending: Emergency Medicine | Admitting: Emergency Medicine

## 2023-09-08 DIAGNOSIS — Z91040 Latex allergy status: Secondary | ICD-10-CM | POA: Diagnosis not present

## 2023-09-08 DIAGNOSIS — R7989 Other specified abnormal findings of blood chemistry: Secondary | ICD-10-CM | POA: Insufficient documentation

## 2023-09-08 DIAGNOSIS — D649 Anemia, unspecified: Secondary | ICD-10-CM | POA: Insufficient documentation

## 2023-09-08 DIAGNOSIS — K8500 Idiopathic acute pancreatitis without necrosis or infection: Secondary | ICD-10-CM

## 2023-09-08 DIAGNOSIS — R109 Unspecified abdominal pain: Secondary | ICD-10-CM | POA: Diagnosis present

## 2023-09-08 LAB — CBC
HCT: 36.3 % (ref 36.0–46.0)
Hemoglobin: 11.9 g/dL — ABNORMAL LOW (ref 12.0–15.0)
MCH: 30 pg (ref 26.0–34.0)
MCHC: 32.8 g/dL (ref 30.0–36.0)
MCV: 91.4 fL (ref 80.0–100.0)
Platelets: 274 10*3/uL (ref 150–400)
RBC: 3.97 MIL/uL (ref 3.87–5.11)
RDW: 13.1 % (ref 11.5–15.5)
WBC: 4.6 10*3/uL (ref 4.0–10.5)
nRBC: 0 % (ref 0.0–0.2)

## 2023-09-08 LAB — COMPREHENSIVE METABOLIC PANEL WITH GFR
ALT: 16 U/L (ref 0–44)
AST: 17 U/L (ref 15–41)
Albumin: 4 g/dL (ref 3.5–5.0)
Alkaline Phosphatase: 105 U/L (ref 38–126)
Anion gap: 7 (ref 5–15)
BUN: 15 mg/dL (ref 6–20)
CO2: 27 mmol/L (ref 22–32)
Calcium: 9 mg/dL (ref 8.9–10.3)
Chloride: 106 mmol/L (ref 98–111)
Creatinine, Ser: 0.68 mg/dL (ref 0.44–1.00)
GFR, Estimated: >60 mL/min (ref >60)
Glucose, Bld: 61 mg/dL — ABNORMAL LOW (ref 70–99)
Potassium: 3.5 mmol/L (ref 3.5–5.1)
Sodium: 140 mmol/L (ref 135–145)
Total Bilirubin: 0.3 mg/dL (ref 0.0–1.2)
Total Protein: 6.6 g/dL (ref 6.5–8.1)

## 2023-09-08 LAB — URINALYSIS, ROUTINE W REFLEX MICROSCOPIC
Bilirubin Urine: NEGATIVE
Glucose, UA: NEGATIVE mg/dL
Hgb urine dipstick: NEGATIVE
Ketones, ur: NEGATIVE mg/dL
Leukocytes,Ua: NEGATIVE
Nitrite: NEGATIVE
Protein, ur: NEGATIVE mg/dL
Specific Gravity, Urine: 1.015 (ref 1.005–1.030)
pH: 6 (ref 5.0–8.0)

## 2023-09-08 LAB — LIPASE, BLOOD: Lipase: 189 U/L — ABNORMAL HIGH (ref 11–51)

## 2023-09-08 MED ORDER — FENTANYL CITRATE PF 50 MCG/ML IJ SOSY
50.0000 ug | PREFILLED_SYRINGE | Freq: Once | INTRAMUSCULAR | Status: AC
  Administered 2023-09-09: 50 ug via INTRAVENOUS
  Filled 2023-09-08: qty 1

## 2023-09-08 MED ORDER — PANTOPRAZOLE SODIUM 40 MG IV SOLR
40.0000 mg | Freq: Once | INTRAVENOUS | Status: AC
  Administered 2023-09-09: 40 mg via INTRAVENOUS
  Filled 2023-09-08: qty 10

## 2023-09-08 MED ORDER — OXYCODONE HCL 5 MG PO TABS
5.0000 mg | ORAL_TABLET | Freq: Once | ORAL | Status: AC
  Administered 2023-09-09: 5 mg via ORAL
  Filled 2023-09-08: qty 1

## 2023-09-08 MED ORDER — LACTATED RINGERS IV BOLUS
1000.0000 mL | Freq: Once | INTRAVENOUS | Status: AC
  Administered 2023-09-08: 1000 mL via INTRAVENOUS

## 2023-09-08 MED ORDER — FENTANYL CITRATE PF 50 MCG/ML IJ SOSY
50.0000 ug | PREFILLED_SYRINGE | Freq: Once | INTRAMUSCULAR | Status: AC
  Administered 2023-09-08: 50 ug via INTRAVENOUS
  Filled 2023-09-08: qty 1

## 2023-09-08 MED ORDER — KETOROLAC TROMETHAMINE 15 MG/ML IJ SOLN
15.0000 mg | Freq: Once | INTRAMUSCULAR | Status: AC
  Administered 2023-09-08: 15 mg via INTRAVENOUS
  Filled 2023-09-08: qty 1

## 2023-09-08 NOTE — ED Triage Notes (Addendum)
 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 Adolphus Birchwood who performed her gastric sleeve

## 2023-09-08 NOTE — ED Provider Notes (Incomplete)
 [place] EMERGENCY DEPARTMENT AT [facility] Provider Note   CSN: [id number] Arrival date & time: 09/08/23  1959     History {Add pertinent medical, surgical, social history, OB history to HPI:1} Chief Complaint  Patient presents with  . Abdominal Pain    Melinda Ward is a 48 y.o. female with history of GERD, gastric sleeve, presents with concern for epigastric pain that started 3 days ago.  States the pain also is felt in the mid back.  She denies any nausea or vomiting, fever or chills, diarrhea, constipation.  Denies any dysuria, hematuria, or increased frequency.  Has tried Tylenol at home for pain, but this has not helped.  States that before this started, she did have a Mimosa.  She does not drink regularly.   Abdominal Pain      Home Medications Prior to Admission medications   Medication Sig Start Date End Date Taking? Authorizing Provider  famotidine (PEPCID) 20 MG tablet Take 1 tablet (20 mg total) by mouth 2 (two) times daily. 11/24/22   Pricilla Loveless, MD  omeprazole (PRILOSEC) 40 MG capsule Take 1 capsule (40 mg total) by mouth 2 (two) times daily before a meal for 7 days. 11/24/22 12/01/22  Pricilla Loveless, MD  sucralfate (CARAFATE) 1 GM/10ML suspension Take 10 mLs (1 g total) by mouth 4 (four) times daily -  with meals and at bedtime. 11/24/22   Pricilla Loveless, MD  valACYclovir (VALTREX) 1000 MG tablet SMARTSIG:1 Tablet(s) By Mouth Every 12 Hours PRN 05/11/20   [provider]      Allergies    Latex, Tape, and Wound dressing adhesive    Review of Systems   Review of Systems  Gastrointestinal:  Positive for abdominal pain.    Physical Exam Updated Vital Signs BP 129/87   Pulse 71   Temp 98 F (36.7 C) (Temporal)   Resp 18   Ht 5\' 6"  (1.676 m)   Wt 83.5 kg   LMP 04/04/2018 (Exact Date)   SpO2 100%   BMI 29.70 kg/m  Physical Exam Vitals and nursing note reviewed.  Constitutional:      General: She is not in acute  distress.    Appearance: She is well-developed.  HENT:     Head: Normocephalic and atraumatic.  Eyes:     Conjunctiva/sclera: Conjunctivae normal.  Cardiovascular:     Rate and Rhythm: Normal rate and regular rhythm.     Heart sounds: No murmur heard. Pulmonary:     Effort: Pulmonary effort is normal. No respiratory distress.     Breath sounds: Normal breath sounds.  Abdominal:     Palpations: Abdomen is soft.     Tenderness: There is no abdominal tenderness.     Comments: Epigastric tenderness to palpation, no rebound or guarding  Musculoskeletal:        General: No swelling.     Cervical back: Neck supple.  Skin:    General: Skin is warm and dry.     Capillary Refill: Capillary refill takes less than 2 seconds.  Neurological:     Mental Status: She is alert.  Psychiatric:        Mood and Affect: Mood normal.     ED Results / Procedures / Treatments   Labs (all labs ordered are listed, but only abnormal results are displayed) Labs Reviewed  LIPASE, BLOOD - Abnormal; Notable for the following components:      Result Value   Lipase 189 (*)    All other components  within normal limits  COMPREHENSIVE METABOLIC PANEL WITH GFR - Abnormal; Notable for the following components:   Glucose, Bld 61 (*)    All other components within normal limits  CBC - Abnormal; Notable for the following components:   Hemoglobin 11.9 (*)    All other components within normal limits  URINALYSIS, ROUTINE W REFLEX MICROSCOPIC    EKG None  Radiology No results found.  Procedures Procedures  {Document cardiac monitor, telemetry assessment procedure when appropriate:1}  Medications Ordered in ED Medications - No data to display  ED Course/ Medical Decision Making/ A&P   {   Click here for ABCD2, HEART and other calculatorsREFRESH Note before signing :1}                              Medical Decision Making Amount and/or Complexity of Data Reviewed Labs: ordered.     Differential  diagnosis includes but is not limited to Cholelithiasis, cholangitis, choledocholithiasis, peptic ulcer, gastritis, gastroenteritis, appendicitis, IBS, IBD, DKA, nephrolithiasis, UTI, pyelonephritis, pancreatitis, diverticulitis, mesenteric ischemia, abdominal aortic aneurysm, small bowel obstruction, volvulus, testicular torsion in males, ovarian torsion and pregnancy related concerns in females of childbearing age    ED Course:  Upon initial evaluation, patient is well-appearing, stable vital signs.  She does have abdominal tenderness in the epigastric region.  No rebound or guarding.  No nausea or vomiting.  Labs Ordered: I Ordered, and personally interpreted labs.  The pertinent results include:   Lipase elevated at 189 CBC without leukocytosis, mild anemia with hemoglobin at 11.9 CMP without any elevation in LFTs or creatinine    Medications Given: 1 LR bolus Fentanyl  Upon re-evaluation, patient ***.      Impression: ***  Disposition:  {AF ED Dispo:29713} Return precautions given.    Record Review: External records from outside source obtained and reviewed including ***     This chart was dictated using voice recognition software, Dragon. Despite the best efforts of this provider to proofread and correct errors, errors may still occur which can change documentation meaning.    {Document critical care time when appropriate:1} {Document review of labs and clinical decision tools ie heart score, Chads2Vasc2 etc:1}  {Document your independent review of radiology images, and any outside records:1} {Document your discussion with family members, caretakers, and with consultants:1} {Document social determinants of health affecting pt's care:1} {Document your decision making why or why not admission, treatments were needed:1} Final Clinical Impression(s) / ED Diagnoses Final diagnoses:  None    Rx / DC Orders ED Discharge Orders     None

## 2023-09-08 NOTE — ED Provider Notes (Addendum)
 [place] [facility] AT [facility] Provider Note   CSN: [id number] Arrival date & time: 09/08/23  1959     History  Chief Complaint  Patient presents with   Abdominal Pain    Melinda Ward is a 48 y.o. female with history of GERD, gastric sleeve, presents with concern for epigastric pain that started 3 days ago.  States the pain also is felt in the mid back.  She denies any nausea or vomiting, fever or chills, diarrhea, constipation.  Denies any dysuria, hematuria, or increased frequency.  Denies any chest pain or shortness of breath.  Has tried Tylenol at home for pain, but this has not helped.  States that before this started, she did have a Mimosa.  She does not drink regularly.   Abdominal Pain      Home Medications Prior to Admission medications   Medication Sig Start Date End Date Taking? Authorizing Provider  oxyCODONE (ROXICODONE) 5 MG immediate release tablet Take 1 tablet (5 mg total) by mouth every 6 (six) hours as needed for up to 5 days for severe pain (pain score 7-10). 09/09/23 09/14/23 Yes Arabella Merles, PA-C  famotidine (PEPCID) 20 MG tablet Take 1 tablet (20 mg total) by mouth 2 (two) times daily. 11/24/22   Pricilla Loveless, MD  omeprazole (PRILOSEC) 40 MG capsule Take 1 capsule (40 mg total) by mouth 2 (two) times daily before a meal for 7 days. 11/24/22 12/01/22  Pricilla Loveless, MD  sucralfate (CARAFATE) 1 GM/10ML suspension Take 10 mLs (1 g total) by mouth 4 (four) times daily -  with meals and at bedtime. 11/24/22   Pricilla Loveless, MD  valACYclovir (VALTREX) 1000 MG tablet SMARTSIG:1 Tablet(s) By Mouth Every 12 Hours PRN 05/11/20   [provider]      Allergies    Latex, Tape, and Wound dressing adhesive    Review of Systems   Review of Systems  Gastrointestinal:  Positive for abdominal pain.    Physical Exam Updated Vital Signs BP 114/77   Pulse (!) 51   Temp 98 F (36.7 C) (Temporal)   Resp 18   Ht 5\' 6"   (1.676 m)   Wt 83.5 kg   LMP 04/04/2018 (Exact Date)   SpO2 100%   BMI 29.70 kg/m  Physical Exam Vitals and nursing note reviewed.  Constitutional:      General: She is not in acute distress.    Appearance: She is well-developed.  HENT:     Head: Normocephalic and atraumatic.  Eyes:     Conjunctiva/sclera: Conjunctivae normal.  Cardiovascular:     Rate and Rhythm: Normal rate and regular rhythm.     Heart sounds: No murmur heard. Pulmonary:     Effort: Pulmonary effort is normal. No respiratory distress.     Breath sounds: Normal breath sounds.  Abdominal:     Palpations: Abdomen is soft.     Tenderness: There is no abdominal tenderness.     Comments: Epigastric tenderness to palpation, no rebound or guarding  Musculoskeletal:        General: No swelling.     Cervical back: Neck supple.  Skin:    General: Skin is warm and dry.     Capillary Refill: Capillary refill takes less than 2 seconds.  Neurological:     Mental Status: She is alert.  Psychiatric:        Mood and Affect: Mood normal.     ED Results / Procedures / Treatments   Labs (all  labs ordered are listed, but only abnormal results are displayed) Labs Reviewed  LIPASE, BLOOD - Abnormal; Notable for the following components:      Result Value   Lipase 189 (*)    All other components within normal limits  COMPREHENSIVE METABOLIC PANEL WITH GFR - Abnormal; Notable for the following components:   Glucose, Bld 61 (*)    All other components within normal limits  CBC - Abnormal; Notable for the following components:   Hemoglobin 11.9 (*)    All other components within normal limits  URINALYSIS, ROUTINE W REFLEX MICROSCOPIC    EKG None  Radiology No results found.  Procedures Procedures    Medications Ordered in ED Medications  lactated ringers bolus 1,000 mL (0 mLs Intravenous Stopped 09/09/23 0012)  fentaNYL (SUBLIMAZE) injection 50 mcg (50 mcg Intravenous Given 09/08/23 2308)  ketorolac (TORADOL)  15 MG/ML injection 15 mg (15 mg Intravenous Given 09/08/23 2307)  oxyCODONE (Oxy IR/ROXICODONE) immediate release tablet 5 mg (5 mg Oral Given 09/09/23 0007)  fentaNYL (SUBLIMAZE) injection 50 mcg (50 mcg Intravenous Given 09/09/23 0007)  pantoprazole (PROTONIX) injection 40 mg (40 mg Intravenous Given 09/09/23 0007)    ED Course/ Medical Decision Making/ A&P                                 Medical Decision Making Amount and/or Complexity of Data Reviewed Labs: ordered.  Risk Prescription drug management.     Differential diagnosis includes but is not limited to Cholelithiasis, cholangitis, choledocholithiasis, peptic ulcer, gastritis, gastroenteritis, appendicitis, IBS, IBD, DKA, nephrolithiasis, UTI, pyelonephritis, pancreatitis, diverticulitis, mesenteric ischemia, abdominal aortic aneurysm, small bowel obstruction, volvulus, testicular torsion in males, ovarian torsion and pregnancy related concerns in females of childbearing age    ED Course:  Upon initial evaluation, patient is well-appearing, stable vital signs.  She does have abdominal tenderness in the epigastric region.  No rebound or guarding.  No nausea or vomiting.  Labs Ordered: I Ordered, and personally interpreted labs.  The pertinent results include:   Lipase elevated at 189 CBC without leukocytosis, mild anemia with hemoglobin at 11.9 CMP without any elevation in LFTs or creatinine.  Hypoglycemia 61    Medications Given: 1 LR bolus Fentanyl and Toradol for pain Protonix for pain and GERD  Upon re-evaluation, patient states she is feeling much better with the medications.  Able to tolerate p.o. intake of juice which was given to also help bring her glucose up as it was low at 61 on her CMP.  Given that she is having epigastric pain and lipase 3 times upper limit of normal, this is concerning for pancreatitis.  Low concern for necrotizing pancreatitis, acute infection, or other complication at this time given no  leukocytosis, no rebound tenderness.  Do not feel need to obtain imaging at this time.  Patient's EKG with normal sinus rhythm, no ST elevations, no chest pain or shortness of breath, low concern for ACS. Will treat for acute pancreatitis at home that was likely induced by her alcohol intake.  She states she has close follow-up with her GI doctor tomorrow.  Stable and appropriate for discharge home  Impression: Acute pancreatitis  Disposition:  The patient was discharged home with instructions to follow liquid diet for the next 2 to 3 days, then advance diet as tolerated.  Oxycodone as needed for pain.  Follow-up with her GI doctor tomorrow.  Follow-up with her PCP within the next  week. Return precautions given.      This chart was dictated using voice recognition software, Dragon. Despite the best efforts of this provider to proofread and correct errors, errors may still occur which can change documentation meaning.          Final Clinical Impression(s) / ED Diagnoses Final diagnoses:  Idiopathic acute pancreatitis, unspecified complication status    Rx / DC Orders ED Discharge Orders          Ordered    oxyCODONE (ROXICODONE) 5 MG immediate release tablet  Every 6 hours PRN        09/09/23 0023              Arabella Merles, PA-C 09/09/23 0026    Arabella Merles, PA-C 09/09/23 1610    Alvira Monday, MD 09/09/23 1056

## 2023-09-09 MED ORDER — OXYCODONE HCL 5 MG PO TABS: 5.0000 mg | ORAL_TABLET | Freq: Four times a day (QID) | ORAL | 0 refills | Status: AC | PRN

## 2023-09-09 NOTE — Discharge Instructions (Addendum)
 You were found to have pancreatitis today, which is inflammation of your pancreas. This most likely was brought on by your intake of alcohol. However, there are other causes and you should make your PCP aware of your pancreatitis today.   You were given fluids and pain medication here to start treating your pancreatitis.   At home, please maintain a liquid diet (smoothies, soups, broths) for the next 2 to 3 days, then you may advance your diet as tolerated.  You have been prescribed Oxycodone-this is a narcotic/controlled substance medication that has potential addicting qualities.  You may take 1 tablet every 6 hours as needed for severe pain.  Do not drive or operate heavy machinery when taking this medicine as it can be sedating. Do not drink alcohol or take other sedating medications when taking this medicine for safety reasons.  Keep this out of reach of small children.  You were given your first dose here today  Your kidney and liver labs are normal today.  Your blood counts were normal.  Your electrolytes are normal.  Your urine did not show any signs of infection.  Return to the ER for any uncontrolled or worsening pain, vomiting, fevers, pain that has not started to improve within the next 24 to 48 hours, any other new or concerning symptoms.

## 2023-11-06 ENCOUNTER — Other Ambulatory Visit: Payer: Self-pay

## 2023-11-06 DIAGNOSIS — M549 Dorsalgia, unspecified: Secondary | ICD-10-CM | POA: Insufficient documentation

## 2023-11-06 DIAGNOSIS — K219 Gastro-esophageal reflux disease without esophagitis: Secondary | ICD-10-CM | POA: Insufficient documentation

## 2023-11-06 DIAGNOSIS — L304 Erythema intertrigo: Secondary | ICD-10-CM | POA: Insufficient documentation

## 2023-11-06 DIAGNOSIS — E7800 Pure hypercholesterolemia, unspecified: Secondary | ICD-10-CM | POA: Insufficient documentation

## 2023-11-06 DIAGNOSIS — G5600 Carpal tunnel syndrome, unspecified upper limb: Secondary | ICD-10-CM | POA: Insufficient documentation

## 2023-11-06 DIAGNOSIS — M7989 Other specified soft tissue disorders: Secondary | ICD-10-CM | POA: Insufficient documentation

## 2023-11-06 DIAGNOSIS — I10 Essential (primary) hypertension: Secondary | ICD-10-CM | POA: Insufficient documentation

## 2023-11-06 DIAGNOSIS — R4702 Dysphasia: Secondary | ICD-10-CM | POA: Insufficient documentation

## 2023-11-11 ENCOUNTER — Ambulatory Visit

## 2023-11-11 VITALS — BP 112/72 | HR 58 | Ht 66.0 in | Wt 194.6 lb

## 2023-11-11 DIAGNOSIS — R001 Bradycardia, unspecified: Secondary | ICD-10-CM | POA: Diagnosis not present

## 2023-11-11 DIAGNOSIS — I340 Nonrheumatic mitral (valve) insufficiency: Secondary | ICD-10-CM | POA: Insufficient documentation

## 2023-11-11 NOTE — Progress Notes (Signed)
 Cardiology Consultation:    Date:  11/11/2023   ID:  Melinda Ward, DOB 1976-02-21, MRN [id number]  PCP:  Ward, Vyvyan, MD  Cardiologist:  Daymon Evans Duffy Dantonio, MD   Referring MD: Ward, Vyvyan, MD   No chief complaint on file.    ASSESSMENT AND PLAN:   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 No significant past history of CAD, CHF, MI, CVA. Referred for further evaluation of sinus bradycardia, relatively asymptomatic and I do not see any significant bradycardia other than sinus rhythm heart rate in 50s at various healthcare visits and on EKGs and on her Apple smart watch readings.   Problem List Items Addressed This Visit     Bradycardia - Primary   Sinus rhythm with resting heart rate in 50s and heart rate ranging from 51 to 111 bpm using her Apple smart watch. No significant symptoms attributable to slow heart rates. No high-grade conduction blocks noted on EKG today. No significant thyroid abnormalities.  Will obtain a 7-day Zio patch monitor to assess for any significant bradycardia arrhythmias or pauses or conduction abnormalities.  Due to her foot injury and pending surgery, unable to obtain a formal treadmill stress test to assess chronotropic response, but I believe a 7-day Zio patch monitor with her routine day-to-day activities should give us  a good estimate of chronotropic response.  If no significant abnormalities on Zio patch monitor for 7 days, this is reassuring and she can continue to follow-up with her PCP.      Relevant Orders   EKG 12-Lead (Completed)   LONG TERM MONITOR (3-14 DAYS)   Mild mitral regurgitation   Mild MR reported on prior echocardiogram from  April 2025.  Otherwise normal biventricular function and diastolic function. Reassured her about the findings and proceed with repeat echocardiogram tentatively in 2 years and this can be scheduled through her PCP. See cardiology for any significant changes on follow-up echocardiogram in the future.      Return to clinic based on test results.   History of Present Illness:    Melinda Ward is a 48 y.o. female who is being seen today for the evaluation of bradycardia at the request of Ward, Vyvyan, MD.  Very pleasant woman here for the visit by herself.  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  Referred for further evaluation of bradycardia that she has been known to have slow heart rates for an extended time.  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 Denies any prior history of CAD, CHF, MI, CVA. Reports history of coronary artery disease and MI in her maternal uncle and paternal aunt and therefore these in 41s.  She denies any significant symptoms attributable to slow heart rates other than mild sense of tiredness at times. Currently since her foot injury she is unable to walk at a fast pace or run.  She is wearing a boot to help relieve the weight on the foot.  Denies any syncopal episodes.  Denies any palpitations.  Denies any chest pain. Denies any shortness of breath. Denies any significant weight changes.  A referral for evaluation of sinus bradycardia was placed in September 2024 by her PCP with this apparently got delayed as she never got the notification for her  appointment scheduled in March with cardiology.  In the past 6 to 7 months she has had episodes of low blood sugars couple times and had evaluation at [facility] and noted to have low blood sugars and was monitored overnight in the hospital and echocardiogram done at the time was reassuring as reviewed below.  She does have Apple smart watch and we reviewed the readings on it which showed resting heart rate 51 bpm and heart rates ranging from 51 bpm to 111 bpm.  She does not wear her watch overnight.  Does not smoke. Quit drinking alcohol after her gastric bypass surgery. Does not use any other substances.  EKG in the clinic today shows sinus rhythm heart rate 58/min, PR interval 154 ms, QRS duration 88 ms, QTc 424 ms.  No significant ST-T changes to suggest ischemia.  No significant change in comparison to prior EKG from September 08, 2023.  Echocardiogram at [facility] from 09/10/2023 reported LVEF 55 to 60% with global longitudinal strain normal -19.7.  Mild mitral regurgitation   Blood work from 09/10/2023 hemoglobin A1c 5.4, well-controlled. CBC with hemoglobin 10.3, hematocrit 32.2, suggest anemia, WBC 3.8 and platelets 235. CMP with BUN 12 and creatinine 0.65, EGFR 110. Normal transaminases and alkaline phosphatase. Magnesium normal 2.1. TSH normal 3.27.   Past Medical History:  Diagnosis Date   Acid reflux    Back pain    Backache 08/21/2010   Formatting of this note might be different from the original.  Backache     10/1 IMO update     Bradycardia 08/19/2023   Carpal tunnel syndrome    Closed fracture of tuft of distal phalanx of right thumb 11/29/2021   Cough 04/02/2010   Formatting of this note might be different from the original.  Cough     Disequilibrium 10/31/2008   Formatting of this note might be different from the original.  Dizziness     Dysphagia 04/02/2010   Formatting of this note might be different from the original.  Difficulty Swallowing With Food Sticking  In The Middle Chest     ICD-10 cut over     Dysphasia    Elevated blood-pressure reading without diagnosis of hypertension 10/31/2008   Formatting of this note might be different from the original.  Blood Pressure Isolated Elevated     Encounter to establish care 07/05/2016   Last Assessment & Plan:   Formatting of this note is different from the original.  History of genital HSV, no medical history otherwise. Counseled on nutrition and health behaviors today-works second shift and finds it hard to find time to exercise but advised her to work out on her days off and tweak diet to reduce intake of fast foods, foods high in saturated fat, etc.  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  Last Assessment & Plan:   Recent outbreak,  buttock area, seems to be resolving.  She will resume daily Valtrex 500 mg and take every 12 hours 3 days when necessary outbreaks.  Last Assessment & Plan:   Formatting of this note might be different from the original.  Recent outbreak, buttock area, seems to be resolving.  She will resume    Hypertension    Insulin  resistance 01/13/2013   Intertrigo    Meralgia paresthetica of right side 05/05/2019   Obesity, unspecified 10/31/2008   Formatting of this note might be different from the original.  Obesity     10/1 IMO update     Onychomycosis due to dermatophyte 05/07/2010   Formatting of this note might be different from the original.  Onychomycosis     OSA on CPAP 02/14/2022   Pain in joints 07/04/2009   Formatting of this note might be different from the original.  Arthralgias In Multiple Sites     Pre-diabetes    Prediabetes  02/14/2022   Rhinitis, chronic 02/19/2019   Swelling of lower extremity    Vitamin D  deficiency     Past Surgical History:  Procedure Laterality Date   ABDOMINAL HYSTERECTOMY     ECTOPIC PREGNANCY SURGERY     FOOT SURGERY Right 05/2020   LAPAROSCOPIC GASTRIC SLEEVE RESECTION  05/01/2022   LYMPH NODE BIOPSY     TUBAL LIGATION      Current Medications: Current Meds  Medication Sig   calcium carbonate (SUPER CALCIUM) 1500 (600 Ca) MG TABS tablet Take 600 mg of elemental calcium by mouth 2 (two) times daily with a meal.   Cholecalciferol 125 MCG (5000 UT) capsule Take 5,000 Units by mouth 2 (two) times daily.   famotidine  (PEPCID ) 20 MG tablet Take 1 tablet (20 mg total) by mouth 2 (two) times daily.   Pediatric Multivitamins-Iron (FLINTSTONES COMPLETE) 18 MG CHEW Chew 1 tablet by mouth 2 (two) times daily.   valACYclovir (VALTREX) 1000 MG tablet Take 1,000 mg by mouth daily.     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  Tobacco Use   Smoking status: Never   Smokeless tobacco: Never  Vaping Use   Vaping status: Never Used  Substance and Sexual Activity   Alcohol use: Not Currently    Comment: occ   Drug use: Never   Sexual activity: Yes  Other Topics Concern   Not on file  Social History Narrative   Caffeine- sodas 2-3 a day, tea   Social Drivers of Health   Financial Resource Strain: Medium Risk (09/26/2023)   Received from [facility]   Overall Financial Resource Strain (CARDIA)    Difficulty of Paying Living Expenses: Somewhat hard  Food Insecurity: Unknown (09/26/2023)   Received [facility]   Hunger Vital Sign    Worried About Running Out of Food in the Last  Year: Patient declined    Ran Out of Food in the Last Year: Never true  Transportation Needs: No Transportation Needs (09/26/2023)   Received from [facility] - Transportation    Lack of Transportation (Medical): No    Lack of Transportation (Non-Medical): No  Physical Activity: Unknown (09/26/2023)  Received from [facility]   Exercise Vital Sign    Days of Exercise per Week: 0 days    Minutes of Exercise per Session: Not on file  Stress: No Stress Concern Present (09/26/2023)   Received from [facility] - Occupational Stress Questionnaire    Feeling of Stress : Not at all  Social Connections: Socially Isolated (09/26/2023)   Received from [facility]   Social Network    How would you rate your social network (family, work, friends)?: Little participation, lonely and socially isolated     Family History: The patient's family history includes Cancer in her father and mother; High blood pressure in her mother; Sleep apnea in her father and maternal uncle; Sudden death in her father; Thyroid disease in her mother. ROS:   Please see the history of present illness.    All 14 point review of systems negative except as described per history of present illness.  EKGs/Labs/Other Studies Reviewed:    The following studies were reviewed today:   EKG:  EKG Interpretation Date/Time:  Tuesday November 11 2023 15:56:28 EDT Ventricular Rate:  58 PR Interval:  154 QRS Duration:  88 QT Interval:  432 QTC Calculation: 424 R Axis:   66  Text Interpretation: Sinus bradycardia Cannot rule out Anterior infarct , age undetermined Abnormal ECG When compared with ECG of 08-Sep-2023 20:16, No significant change was found Confirmed by Bertha Broad reddy [phone]) on 11/11/2023 4:08:05 PM    Recent Labs: 09/08/2023: ALT 16; BUN 15; Creatinine, Ser 0.68; Hemoglobin 11.9; Platelets 274; Potassium 3.5; Sodium 140  Recent Lipid Panel     Component Value Date/Time   CHOL 193 10/24/2021 0923   TRIG 53 10/24/2021 0923   HDL 84 10/24/2021 0923   LDLCALC 99 10/24/2021 0923    Physical Exam:    VS:  BP 112/72   Pulse (!) 58   Ht 5\' 6"  (1.676 m)   Wt 194 lb 9.6 oz (88.3 kg)   LMP 04/04/2018 (Exact Date)   SpO2 99%   BMI 31.41 kg/m     Wt Readings from Last 3 Encounters:  11/11/23 194 lb 9.6 oz (88.3 kg)  09/08/23 184 lb (83.5 kg)  06/09/23 182 lb (82.6 kg)     GENERAL:  Well nourished, well developed in no acute distress NECK: No JVD; No carotid bruits CARDIAC: RRR, S1 and S2 present, no murmurs, no rubs, no gallops CHEST:  Clear to auscultation without rales, wheezing or rhonchi  Extremities: No pitting pedal edema. Pulses bilaterally symmetric with radial 2+ and dorsalis pedis 2+ NEUROLOGIC:  Alert and oriented x 3  Medication Adjustments/Labs and Tests Ordered: Current medicines are reviewed at length with the patient today.  Concerns regarding medicines are outlined above.  Orders Placed This Encounter  Procedures   LONG TERM MONITOR (3-14 DAYS)   EKG 12-Lead   No orders of the defined types were placed in this encounter.   Signed, Charlott Calvario reddy Cristian Grieves, MD, MPH, [facility]. 11/11/2023 5:00 PM    [facility]

## 2023-11-11 NOTE — Assessment & Plan Note (Signed)
 Mild MR reported on prior echocardiogram from April 2025.  Otherwise normal biventricular function and diastolic function. Reassured her about the findings and proceed with repeat echocardiogram tentatively in 2 years and this can be scheduled through her PCP. See cardiology for any significant changes on follow-up echocardiogram in the future.

## 2023-11-11 NOTE — Assessment & Plan Note (Signed)
 Sinus rhythm with resting heart rate in 50s and heart rate ranging from 51 to 111 bpm using her Apple smart watch. No significant symptoms attributable to slow heart rates. No high-grade conduction blocks noted on EKG today. No significant thyroid abnormalities.  Will obtain a 7-day Zio patch monitor to assess for any significant bradycardia arrhythmias or pauses or conduction abnormalities.  Due to her foot injury and pending surgery, unable to obtain a formal treadmill stress test to assess chronotropic response, but I believe a 7-day Zio patch monitor with her routine day-to-day activities should give us  a good estimate of chronotropic response.  If no significant abnormalities on Zio patch monitor for 7 days, this is reassuring and she can continue to follow-up with her PCP.

## 2023-11-11 NOTE — Patient Instructions (Signed)
 Medication Instructions:  Your physician recommends that you continue on your current medications as directed. Please refer to the Current Medication list given to you today.  *If you need a refill on your cardiac medications before your next appointment, please call your pharmacy*   Lab Work: None ordered If you have labs (blood work) drawn today and your tests are completely normal, you will receive your results only by: MyChart Message (if you have MyChart) OR A paper copy in the mail If you have any lab test that is abnormal or we need to change your treatment, we will call you to review the results.   Testing/Procedures: A zio monitor was ordered today. It will remain on for 14 days. Remove 11/18/23. You will then return monitor and event diary in provided box. It takes 1-2 weeks for report to be downloaded and returned to us . We will call you with the results. If monitor falls off or has orange flashing light, please call [employer] for further instructions.    Follow-Up: At [facility], you and your health needs are our priority.  As part of our continuing mission to provide you with exceptional heart care, we have created designated Provider Care Teams.  These Care Teams include your primary Cardiologist (physician) and Advanced Practice Providers (APPs -  Physician Assistants and Nurse Practitioners) who all work together to provide you with the care you need, when you need it.  We recommend signing up for the patient portal called "MyChart".  Sign up information is provided on this After Visit Summary.  MyChart is used to connect with patients for Virtual Visits (Telemedicine).  Patients are able to view lab/test results, encounter notes, upcoming appointments, etc.  Non-urgent messages can be sent to your provider as well.   To learn more about what you can do with MyChart, go to [email or web address].    Your next appointment:   Follow up based on results   The format for  your next appointment:   In Person  Provider:   Bertha Broad, MD    Other Instructions none  Important Information About Sugar

## 2024-02-16 ENCOUNTER — Ambulatory Visit: Payer: Self-pay

## 2024-02-16 DIAGNOSIS — R001 Bradycardia, unspecified: Secondary | ICD-10-CM
